# Patient Record
Sex: Female | Born: 1935 | Race: White | Hispanic: No | Smoking: Never smoker
Health system: Southern US, Community
[De-identification: ages and names within clinical notes are randomized; demographics above are authoritative.]

## PROBLEM LIST (undated history)

## (undated) DIAGNOSIS — E039 Hypothyroidism, unspecified: Secondary | ICD-10-CM

## (undated) DIAGNOSIS — I10 Essential (primary) hypertension: Secondary | ICD-10-CM

## (undated) DIAGNOSIS — H9190 Unspecified hearing loss, unspecified ear: Secondary | ICD-10-CM

## (undated) DIAGNOSIS — F0390 Unspecified dementia without behavioral disturbance: Secondary | ICD-10-CM

---

## 2019-05-27 ENCOUNTER — Emergency Department (HOSPITAL_COMMUNITY): Payer: Medicare Other

## 2019-05-27 ENCOUNTER — Encounter (HOSPITAL_COMMUNITY): Payer: Self-pay

## 2019-05-27 ENCOUNTER — Other Ambulatory Visit: Payer: Self-pay

## 2019-05-27 ENCOUNTER — Emergency Department (HOSPITAL_COMMUNITY)
Admission: EM | Admit: 2019-05-27 | Discharge: 2019-05-28 | Disposition: A | Discharge status: Skilled Nursing Facility | Payer: Medicare Other | Attending: Emergency Medicine | Admitting: Emergency Medicine

## 2019-05-27 DIAGNOSIS — F0390 Unspecified dementia without behavioral disturbance: Secondary | ICD-10-CM | POA: Insufficient documentation

## 2019-05-27 DIAGNOSIS — M25551 Pain in right hip: Secondary | ICD-10-CM | POA: Diagnosis not present

## 2019-05-27 DIAGNOSIS — Z20822 Contact with and (suspected) exposure to covid-19: Secondary | ICD-10-CM | POA: Diagnosis not present

## 2019-05-27 DIAGNOSIS — Z7189 Other specified counseling: Secondary | ICD-10-CM | POA: Insufficient documentation

## 2019-05-27 DIAGNOSIS — R531 Weakness: Secondary | ICD-10-CM | POA: Diagnosis present

## 2019-05-27 DIAGNOSIS — M25561 Pain in right knee: Secondary | ICD-10-CM | POA: Diagnosis not present

## 2019-05-27 DIAGNOSIS — M25559 Pain in unspecified hip: Secondary | ICD-10-CM

## 2019-05-27 LAB — CBC WITH DIFFERENTIAL/PLATELET
Abs Immature Granulocytes: 0.03 10*3/uL (ref 0.00–0.07)
Basophils Absolute: 0.1 10*3/uL (ref 0.0–0.1)
Basophils Relative: 1 %
Eosinophils Absolute: 0.1 10*3/uL (ref 0.0–0.5)
Eosinophils Relative: 1 %
HCT: 36.7 % (ref 36.0–46.0)
Hemoglobin: 11.6 g/dL — ABNORMAL LOW (ref 12.0–15.0)
Immature Granulocytes: 0 %
Lymphocytes Relative: 18 %
Lymphs Abs: 1.3 10*3/uL (ref 0.7–4.0)
MCH: 29.5 pg (ref 26.0–34.0)
MCHC: 31.6 g/dL (ref 30.0–36.0)
MCV: 93.4 fL (ref 80.0–100.0)
Monocytes Absolute: 0.6 10*3/uL (ref 0.1–1.0)
Monocytes Relative: 8 %
Neutro Abs: 5.2 10*3/uL (ref 1.7–7.7)
Neutrophils Relative %: 72 %
Platelets: 260 10*3/uL (ref 150–400)
RBC: 3.93 MIL/uL (ref 3.87–5.11)
RDW: 14.2 % (ref 11.5–15.5)
WBC: 7.3 10*3/uL (ref 4.0–10.5)
nRBC: 0 % (ref 0.0–0.2)

## 2019-05-27 LAB — LACTIC ACID, PLASMA: Lactic Acid, Venous: 1.4 mmol/L (ref 0.5–1.9)

## 2019-05-27 LAB — COMPREHENSIVE METABOLIC PANEL
ALT: 27 U/L (ref 0–44)
AST: 43 U/L — ABNORMAL HIGH (ref 15–41)
Albumin: 3.7 g/dL (ref 3.5–5.0)
Alkaline Phosphatase: 113 U/L (ref 38–126)
Anion gap: 9 (ref 5–15)
BUN: 20 mg/dL (ref 8–23)
CO2: 28 mmol/L (ref 22–32)
Calcium: 9.2 mg/dL (ref 8.9–10.3)
Chloride: 104 mmol/L (ref 98–111)
Creatinine, Ser: 0.97 mg/dL (ref 0.44–1.00)
GFR calc Af Amer: >60 mL/min (ref >60)
GFR calc non Af Amer: 52 mL/min — ABNORMAL LOW (ref >60)
Glucose, Bld: 112 mg/dL — ABNORMAL HIGH (ref 70–99)
Potassium: 4.1 mmol/L (ref 3.5–5.1)
Sodium: 141 mmol/L (ref 135–145)
Total Bilirubin: 0.5 mg/dL (ref 0.3–1.2)
Total Protein: 7.3 g/dL (ref 6.5–8.1)

## 2019-05-27 LAB — URINALYSIS, ROUTINE W REFLEX MICROSCOPIC
Bacteria, UA: NONE SEEN
Bilirubin Urine: NEGATIVE
Glucose, UA: NEGATIVE mg/dL
Ketones, ur: NEGATIVE mg/dL
Leukocytes,Ua: NEGATIVE
Nitrite: NEGATIVE
Protein, ur: 100 mg/dL — AB
Specific Gravity, Urine: 1.02 (ref 1.005–1.030)
pH: 5 (ref 5.0–8.0)

## 2019-05-27 LAB — PROTIME-INR
INR: 1 (ref 0.8–1.2)
Prothrombin Time: 12.6 seconds (ref 11.4–15.2)

## 2019-05-27 LAB — POC SARS CORONAVIRUS 2 AG -  ED: SARS Coronavirus 2 Ag: NEGATIVE

## 2019-05-27 LAB — BRAIN NATRIURETIC PEPTIDE: B Natriuretic Peptide: 517 pg/mL — ABNORMAL HIGH (ref 0.0–100.0)

## 2019-05-27 LAB — CBG MONITORING, ED: Glucose-Capillary: 108 mg/dL — ABNORMAL HIGH (ref 70–99)

## 2019-05-27 MED ORDER — ONDANSETRON HCL 4 MG PO TABS: 4.0000 mg | ORAL_TABLET | Freq: Three times a day (TID) | ORAL | Status: DC | PRN

## 2019-05-27 MED ORDER — FUROSEMIDE 10 MG/ML IJ SOLN
20.0000 mg | Freq: Every day | INTRAMUSCULAR | Status: DC
  Administered 2019-05-27: 20 mg via INTRAVENOUS
  Filled 2019-05-27: qty 4
  Filled 2019-05-27 (×2): qty 2

## 2019-05-27 MED ORDER — TIMOLOL MALEATE 0.5 % OP SOLN
1.0000 [drp] | Freq: Every day | OPHTHALMIC | Status: DC
  Administered 2019-05-27 – 2019-05-28 (×2): 1 [drp] via OPHTHALMIC
  Filled 2019-05-27 (×2): qty 5

## 2019-05-27 MED ORDER — FUROSEMIDE 40 MG PO TABS: 20.0000 mg | ORAL_TABLET | Freq: Every day | ORAL | Status: DC

## 2019-05-27 MED ORDER — GABAPENTIN 100 MG PO CAPS: 100.0000 mg | ORAL_CAPSULE | Freq: Every day | ORAL | Status: DC

## 2019-05-27 MED ORDER — LEVOTHYROXINE SODIUM 50 MCG PO TABS
50.0000 ug | ORAL_TABLET | Freq: Every day | ORAL | Status: DC
  Administered 2019-05-28: 50 ug via ORAL
  Filled 2019-05-27 (×2): qty 1

## 2019-05-27 NOTE — Progress Notes (Signed)
2nd shift ED CSW received a handoff from the 1st shift [facility] ED CSW.   Per 1st shift ED CSW PT has been consulted and a COVID test initiated by the EPD.  CSW spoke to EDP who requested that initiation of the placement process be started while awaiting the results of the CT scan and states that pt will require placement.  CSW spoke to pt's daughter to make her aware that:  1.  If a timely placement is found then the pt must go to the first placement, or choose from the facilities that the pt can immediately find placement if more than one, at or the pt must go home due to time constraints in the ED.  2.  If timely placement is not found then pt's daughter may have to arrange a safe D/C plan for the pt so that upon discharge the daughter can be ready to take pt home.  3.  Pt may not be appropriate for placement and pt will have to be taken home if so.  4. Pt's daughter will have to be agreeable to taking pt home after a possible SNF placement stay is completed (if placed) which is, on average, 10-20 days.  Pt's daughter was agreeable to the above and enthusiastic about picking the pt up from rehab and states her hope is that a SNF stay will get her mother able to care for herself somewhat.  CSW spoke pt's daughterand confirmed pt's daughter's plan to be discharged to SNF for rehab at discharge.  CSW provided active listening and validated pt's daughter's concerns.   CSW was given permission to complete FL-2 and send referrals out to SNF facilities via the hub per pt's request.  Pt has been living independently but with her daughter, prior to being admitted to [facility] ED.  CSW will continue to follow for D/C needs.  Dorothe Pea. Nazli Penn, LCSW, LCAS, CSI Transitions of Care Clinical Social Worker Care Coordination Department Ph: [phone]

## 2019-05-27 NOTE — ED Triage Notes (Signed)
Patient arrived via [facility]. Patient is from home. Patient is at baseline however has been having increased weakness for 3-5 days along with right hip and right knee pain. Patient is taken care of at home by family. Patient family and EMS stated foul odor from urine.

## 2019-05-27 NOTE — ED Provider Notes (Signed)
CT hip does not demonstrate occult fracture.  Patient awaiting nursing home placement.   Gerhard Munch, MD 05/27/19 [phone]

## 2019-05-27 NOTE — TOC Initial Note (Signed)
Transition of Care [facility]) - Initial/Assessment Note    Patient Details  Name: Tina Stein MRN: [id number] Date of Birth: 11-13-1927  [facility] of [facility]) CM/SW Contact:    Montine Circle, LCSW Phone Number: 05/27/2019, 2:46 PM  Clinical Narrative:                 CSW spoke with patient's daughter Arline Asp [phone]) regarding patient's needs. Arline Asp reports that she and patient live together and taking care of patient has become difficult for her. Patient has been declining and lately had been complaining of right knee and right hip pain. Arline Asp reports she has tried to get patient into an ALF a few months ago ([facility]) while trying to get Medicaid for patient, but ended the process. Arline Asp reports she can get the Medicaid process started again, but will have to fill out another application. Arline Asp reports she nor patient have the finances to pay out of pocket. Arline Asp reports she also works so patient is home by herself at times. Arline Asp reports patient has no DME at home.   CSW informed Arline Asp of the options to assist get better care for patient. CSW explained the SNF process. Arline Asp was agreeable for CSW to have EDP consult PT for an evaluation/recommendations. CSW encouraged Arline Asp to work on getting patient Medicaid as she will need somewhere to go after STR. In reviewing chart, COVID test has already been ordered. CSW will continue to follow up.   Expected Discharge Plan: Skilled Nursing Facility Barriers to Discharge: No Barriers Identified   Patient Goals and CMS Choice Patient states their goals for this hospitalization and ongoing recovery are:: To find better care for patient      Expected Discharge Plan and Services Expected Discharge Plan: Skilled Nursing Facility In-house Referral: Clinical Social Work   Post Acute Care Choice: Skilled Nursing Facility Living arrangements for the past 2 months: Single Family Home                                       Prior Living Arrangements/Services Living arrangements for the past 2 months: Single Family Home Lives with:: Adult Children Patient language and need for interpreter reviewed:: Yes Do you feel safe going back to the place where you live?: Yes      Need for Family Participation in Patient Care: Yes (Comment) Care giver support system in place?: Yes (comment)   Criminal Activity/Legal Involvement Pertinent to Current Situation/Hospitalization: No - Comment as needed  Activities of Daily Living      Permission Sought/Granted Permission sought to share information with : Family Supports, Magazine features editor    Share Information with NAME: Merryl Hacker  Permission granted to share info w AGENCY: SNFs  Permission granted to share info w Relationship: daughter  Permission granted to share info w Contact Information: [phone]  Emotional Assessment Appearance:: (Unable to assess) Attitude/Demeanor/Rapport: Unable to Assess Affect (typically observed): Unable to Assess Orientation: : Fluctuating Orientation (Suspected and/or reported Sundowners)(patient has dementia) Alcohol / Substance Use: Not Applicable Psych Involvement: No (comment)  Admission diagnosis:  weakness right leg pain There are no problems to display for this patient.  PCP:  Laqueta Due., MD Pharmacy:  No Pharmacies Listed    Social Determinants of Health (SDOH) Interventions    Readmission Risk Interventions No flowsheet data found.

## 2019-05-27 NOTE — Progress Notes (Signed)
Pt was provided with a PASRR on 05/27/19 by [place] MUST:   [id number]   CSW will continue to follow for D/C needs.  Dorothe Pea. Zayla Agar, LCSW, LCAS, CSI Transitions of Care Clinical Social Worker Care Coordination Department Ph: [phone]

## 2019-05-27 NOTE — Progress Notes (Signed)
Assessment complete; FL-2 created/signed by EPD, negative COVID result obtained, PT will have to see pt again on 1/14 as pt was in CT when PT arrived.  Referrals sent out to [facility] [place] [facility], per pt's daughter's wishes.  2nd shift ED CSW will leave handoff for 1st shift ED CSW.  CSW will continue to follow for D/C needs.  Dorothe Pea. Bryer Cozzolino, LCSW, LCAS, CSI Transitions of Care Clinical Social Worker Care Coordination Department Ph: [phone]

## 2019-05-27 NOTE — ED Provider Notes (Signed)
[facility] DEPT [facility] Emergency Department Provider Note MRN:  [id number]  Arrival date & time: 05/27/19     Chief Complaint   Weakness, Possible UTI, Right Hip Pain, and Right Knee Pain   History of Present Illness   Tina Stein is a 86 y.o. year-old female with a history of dementia presenting to the ED with chief complaint of weakness.  Reported weakness, hip pain, knee pain, foul-smelling urine.  Unable to reach family.  I was unable to obtain an accurate HPI, PMH, or ROS due to the patient's dementia.  Level 5 caveat.  Review of Systems  Positive for weakness, hip pain, foul-smelling urine.  Patient's Health History   History reviewed. No pertinent past medical history.  History reviewed. No pertinent surgical history.  No family history on file.  Social History   Socioeconomic History  . Marital status: Widowed    Spouse name: Not on file  . Number of children: Not on file  . Years of education: Not on file  . Highest education level: Not on file  Occupational History  . Not on file  Tobacco Use  . Smoking status: Unknown If Ever Smoked  Substance and Sexual Activity  . Alcohol use: Not Currently  . Drug use: Never  . Sexual activity: Not Currently  Other Topics Concern  . Not on file  Social History Narrative  . Not on file   Social Determinants of Health   Financial Resource Strain:   . Difficulty of Paying Living Expenses: Not on file  Food Insecurity:   . Worried About Programme researcher, broadcasting/film/video in the Last Year: Not on file  . Ran Out of Food in the Last Year: Not on file  Transportation Needs:   . Lack of Transportation (Medical): Not on file  . Lack of Transportation (Non-Medical): Not on file  Physical Activity:   . Days of Exercise per Week: Not on file  . Minutes of Exercise per Session: Not on file  Stress:   . Feeling of Stress : Not on file  Social Connections:   . Frequency of Communication with Friends and Family: Not on file    . Frequency of Social Gatherings with Friends and Family: Not on file  . Attends Religious Services: Not on file  . Active Member of Clubs or Organizations: Not on file  . Attends Banker Meetings: Not on file  . Marital Status: Not on file  Intimate Partner Violence:   . Fear of Current or Ex-Partner: Not on file  . Emotionally Abused: Not on file  . Physically Abused: Not on file  . Sexually Abused: Not on file     Physical Exam  Vital Signs and Nursing Notes reviewed Vitals:   05/27/19 1400 05/27/19 1430  BP: (!) 147/110 136/62  Pulse: 82 (!) 101  Resp: (!) 23 17  Temp:    SpO2: 99% 99%    CONSTITUTIONAL: Chronically ill-appearing, NAD NEURO:  Alert and oriented x 3, no focal deficits EYES:  eyes equal and reactive ENT/NECK:  no LAD, no JVD CARDIO: Regular rate, well-perfused, normal S1 and S2 PULM:  CTAB no wheezing or rhonchi GI/GU:  normal bowel sounds, non-distended, non-tender MSK/SPINE:  No gross deformities, no edema; pain elicited with range of motion of the right knee and hip SKIN:  no rash, atraumatic PSYCH:  Appropriate speech and behavior  Diagnostic and Interventional Summary    EKG Interpretation  Date/Time:    Ventricular Rate:  PR Interval:    QRS Duration:   QT Interval:    QTC Calculation:   R Axis:     Text Interpretation:        Labs Reviewed  COMPREHENSIVE METABOLIC PANEL - Abnormal; Notable for the following components:      Result Value   Glucose, Bld 112 (*)    AST 43 (*)    GFR calc non Af Amer 52 (*)    All other components within normal limits  CBC WITH DIFFERENTIAL/PLATELET - Abnormal; Notable for the following components:   Hemoglobin 11.6 (*)    All other components within normal limits  URINALYSIS, ROUTINE W REFLEX MICROSCOPIC - Abnormal; Notable for the following components:   Hgb urine dipstick SMALL (*)    Protein, ur 100 (*)    All other components within normal limits  BRAIN NATRIURETIC PEPTIDE -  Abnormal; Notable for the following components:   B Natriuretic Peptide 517.0 (*)    All other components within normal limits  CBG MONITORING, ED - Abnormal; Notable for the following components:   Glucose-Capillary 108 (*)    All other components within normal limits  URINE CULTURE  LACTIC ACID, PLASMA  PROTIME-INR  POC SARS CORONAVIRUS 2 AG -  ED    XR Pelvis    (Results Pending)  DG Femur Min 2 Views Right    (Results Pending)  DG Knee Complete 4 Views Right    (Results Pending)  DG Chest Portable 1 View    (Results Pending)    Medications - No data to display   Procedures  /  Critical Care Procedures  ED Course and Medical Decision Making  I have reviewed the triage vital signs, the nursing notes, and pertinent available records from the EMR.  Pertinent labs & imaging results that were available during my care of the patient were reviewed by me and considered in my medical decision making (see below for details).     Considering UTI, metabolic disarray, dehydration, hip fracture, work-up pending.  X-rays are pending.  Patient's daughter is bedside and explains that her dementia has slowly worsen for the past several months and now that patient has been unable to ambulate, she is unable to care for patient at home.  There is no other assistance in the home.  Daughter interested in SNF placement.  If x-rays are without fracture or reason for admission, will need to stay in the emergency department until transitional care team can place.  There was a recent fall, so I suspect traumatic reason for patient's pain.  No fever, no leukocytosis, doubt septic joint.  Would consider CT scan of the hip if there is no obvious fracture but considerable osteopenia.  Signed out to oncoming provider at shift change.  Barth Kirks. Sedonia Small, MD [place] [email or web address]  Final Clinical Impressions(s) / ED Diagnoses     ICD-10-CM   1. Hip pain   M25.559     ED Discharge Orders    None       Discharge Instructions Discussed with and Provided to Patient:   Discharge Instructions   None       Maudie Flakes, MD 05/27/19 1459

## 2019-05-27 NOTE — NC FL2 (Signed)
  [place] MEDICAID FL2 LEVEL OF CARE SCREENING TOOL     IDENTIFICATION  Patient Name: Tina Stein Birthdate: [birth year] Sex: female Admission Date (Current Location): 05/27/2019  [place] [facility] [place] Number:  Producer, television/film/video and Address:  [facility],  [address] [place] [address]      Provider Number: [id number]  Attending Physician Name and Address:  Default, Provider, MD  Relative Name and Phone Number:       Current Level of Care: Hospital Recommended Level of Care: Skilled Nursing Facility Prior Approval Number:    Date Approved/Denied:   PASRR Number: [id number]  Discharge Plan: SNF    Current Diagnoses: There are no problems to display for this patient.   Orientation RESPIRATION BLADDER Height & Weight     Self, Time, Situation, Place  Normal Incontinent Weight: 150 lb 12.7 oz (68.4 kg) Height:  5\' 4"  (162.6 cm)  BEHAVIORAL SYMPTOMS/MOOD NEUROLOGICAL BOWEL NUTRITION STATUS      Incontinent Diet(Regular)  AMBULATORY STATUS COMMUNICATION OF NEEDS Skin   Extensive Assist Verbally Normal(Pt has stage 1 moisture-associated redness on pt's bottom and scaral lumbar region)                       Personal Care Assistance Level of Assistance  Bathing, Dressing Bathing Assistance: Limited assistance   Dressing Assistance: Limited assistance     Functional Limitations Info  (Pt has altered mental status) Sight Info: Adequate   Speech Info: Adequate    SPECIAL CARE FACTORS FREQUENCY  PT (By licensed PT), OT (By licensed OT)     PT Frequency: 5 OT Frequency: 5            Contractures Contractures Info: Not present    Additional Factors Info  Code Status, Allergies Code Status Info: Not on file Allergies Info: No Known Allergies           Current Medications (05/27/2019):  This is the current hospital active medication list Current Facility-Administered Medications  Medication Dose Route Frequency Provider Last  Rate Last Admin  . furosemide (LASIX) tablet 20 mg  20 mg Oral Daily 05/29/2019, MD      . gabapentin (NEURONTIN) capsule 100 mg  100 mg Oral QHS Gerhard Munch, MD      . Gerhard Munch ON 05/28/2019] levothyroxine (SYNTHROID) tablet 50 mcg  50 mcg Oral Q0600 05/30/2019, MD      . ondansetron [facility]) tablet 4 mg  4 mg Oral Q8H PRN [facility], MD      . timolol (TIMOPTIC) 0.5 % ophthalmic solution 1 drop  1 drop Both Eyes Daily Gerhard Munch, MD       Current Outpatient Medications  Medication Sig Dispense Refill  . furosemide (LASIX) 20 MG tablet take 1 tablet by mouth once daily    . levothyroxine (SYNTHROID) 50 MCG tablet take 1 tablet by mouth once daily    . ondansetron (ZOFRAN) 4 MG tablet Take 4 mg by mouth every 8 (eight) hours as needed for vomiting.     . timolol (TIMOPTIC) 0.5 % ophthalmic solution Place 1 drop into both eyes daily.     Gerhard Munch gabapentin (NEURONTIN) 100 MG capsule Take 100 mg by mouth at bedtime.       Discharge Medications: Please see discharge summary for a list of discharge medications.  Relevant Imaging Results:  Relevant Lab Results:   Additional Information Marland Kitchen  [id number] Panfilo Ketchum, LCSW

## 2019-05-27 NOTE — ED Notes (Signed)
Patient transported to X-ray 

## 2019-05-27 NOTE — Progress Notes (Signed)
PT Cancellation Note  Patient Details Name: Wendi Lastra MRN: [id number] DOB: June 02, 1927   Cancelled Treatment:    Reason Eval/Treat Not Completed: Patient at procedure or test/unavailable(pt out of room getting CT. Will check back at later date/time as schedule allows and when patient is availble.)   Renaldo Fiddler PT, DPT Physical Therapist with [facility] [phone]  05/27/2019 5:18 PM

## 2019-05-27 NOTE — ED Notes (Signed)
Patient has been cleaned and changed. Patient presented with feces covering vagina and buttocks.

## 2019-05-28 LAB — URINE CULTURE: Culture: NO GROWTH

## 2019-05-28 NOTE — ED Notes (Signed)
Pt found covered in urine and stool, along with feet having feces on them. Pt provided with a partial bath and peri care, a new purewick and fresh linens. Pt also provided with barrier cream due to bottom being red. Pt repositioned in bed and given warm blankets. Pt's bed is in the lowest position.

## 2019-05-28 NOTE — ED Notes (Signed)
This lady is sleeping all day.  She wakes up without difficulty but goes right back to sleep.  She has not eaten anything today.

## 2019-05-28 NOTE — Discharge Planning (Signed)
Clinical Social Work is seeking post-discharge placement for this patient at the following level of care: SNF.    

## 2019-05-28 NOTE — Progress Notes (Addendum)
CSW received a call from Velna Hatchet at [facility] stating the patient has been offered a bed and has been accepted and that the pt can arrive ASAP on 05/28/19.  The pt's accepting doctor is the SNF MD.  The room number will be [address].  The number for report is [phone] and ask for the [facility] phone.  CSW will update RN and EDP.  Dorothe Pea. Jonnatan Hanners, Theresia Majors, LCAS Clinical Social Worker Ph: [phone]

## 2019-05-28 NOTE — ED Notes (Signed)
Patient is resting quietly.  No distress noted.  Patients daughter called to check on her.

## 2019-05-28 NOTE — ED Notes (Signed)
[facility] called for transport.  

## 2019-05-28 NOTE — Discharge Instructions (Signed)
Please follow-up with your physician.  Return here for concerning changes in your condition. 

## 2019-05-28 NOTE — ED Notes (Signed)
Pt discharged safely with [facility].  Pt was in no distress.  All belongings were sent with patient. 

## 2019-05-28 NOTE — Progress Notes (Signed)
CSW continues to seek SNF placement. CSW awaiting PT to come evaluate patient.   Geralyn Corwin, LCSW Transitions of Care Department [facility] ED [phone]

## 2019-05-28 NOTE — Evaluation (Signed)
Physical Therapy Evaluation Patient Details Name: Tina Stein MRN: [id number] DOB: 1927-08-29 Today's Date: 05/28/2019   History of Present Illness  Tina Stein is a 86 y.o. year-old female with a history of dementia presenting to the ED with chief complaint of weakness.    Clinical Impression  Tina Stein is 86 y.o. female admitted with above HPI and diagnosis. Patient is currently limited by functional impairments below (see PT problem list). Patient has been living with her daughter who is no longer abel to provide the amount of care/assistance required. Patient currently requires mod-max assist for bed mob and transfers with RW. Patient will benefit from continued skilled PT interventions to address impairments and progress independence with mobility, recommending SNF with 24/7 assist. Acute PT will follow and progress as able.     Follow Up Recommendations SNF;Supervision/Assistance - 24 hour    Equipment Recommendations  None recommended by PT;Other (comment)(defer to facility)    Recommendations for Other Services       Precautions / Restrictions Precautions Precautions: Fall Restrictions Weight Bearing Restrictions: No      Mobility  Bed Mobility Overal bed mobility: Needs Assistance Bed Mobility: Supine to Sit;Sit to Supine     Supine to sit: Max assist;Mod assist Sit to supine: +2 for safety/equipment;+2 for physical assistance;Total assist   General bed mobility comments: pt with diffuclty follow simple verbal cues and required tactile and visual cues for sequencing UE reaching and use of bed rails. Mod assist required for LE mobility to move to EOB and Max assis to raise trunk upright to sit and scoot to EOB. Max +2 required to return to supine as pt was unable to follow cues..  Transfers Overall transfer level: Needs assistance Equipment used: Rolling walker (2 wheeled) Transfers: Sit to/from Stand Sit to Stand: Mod assist;From elevated surface          General transfer comment: visual and tactile cues for hand placement on RW and mod assist to initiate power up from EOB. therapist blockign pt's feet from sliding initiatlly upon rising.  Ambulation/Gait Ambulation/Gait assistance: Mod assist Gait Distance (Feet): 3 Feet Assistive device: Rolling walker (2 wheeled) Gait Pattern/deviations: Step-to pattern;Decreased stride length;Narrow base of support Gait velocity: slow and unsteady   General Gait Details: pt able to perform small side stepping at EOB with mod assist for balance and to manage RW position. She had difficulty sequencing steps and attempted to move RW outside BOS multiple times. Pt with greater difficulty performing forward step compared to side steps. Pt expressed fear of falling while standing and stepping.  Stairs            Wheelchair Mobility    Modified Rankin (Stroke Patients Only)       Balance Overall balance assessment: Needs assistance Sitting-balance support: Feet supported;Bilateral upper extremity supported Sitting balance-Leahy Scale: Fair     Standing balance support: During functional activity;Bilateral upper extremity supported Standing balance-Leahy Scale: Poor                Pertinent Vitals/Pain Pain Assessment: Faces Faces Pain Scale: Hurts even more Pain Location: generalized with LE mobility Pain Descriptors / Indicators: Grimacing;Moaning Pain Intervention(s): Monitored during session;Limited activity within patient's tolerance    Home Living Family/patient expects to be discharged to:: Skilled nursing facility                 Additional Comments: pt admitted from home with daughter, pt unable to provide history for home environement. chart reveiw indicates pt's daughter  is concerned she will have trouble caring for her as she has required increased assistance in last several months and has been unable to ambulate in home.    Prior Function          Hand  Dominance        Extremity/Trunk Assessment   Upper Extremity Assessment Upper Extremity Assessment: Generalized weakness    Lower Extremity Assessment Lower Extremity Assessment: Generalized weakness    Cervical / Trunk Assessment Cervical / Trunk Assessment: Kyphotic  Communication      Cognition Arousal/Alertness: Awake/alert Behavior During Therapy: WFL for tasks assessed/performed Overall Cognitive Status: History of cognitive impairments - at baseline            General Comments: pt with baseline dementia and pt unable to answer simple questions and unable to consitently follow one step commands. she requried tactile cues for sequencing bed mob and transfers.      General Comments      Exercises     Assessment/Plan    PT Assessment Patient needs continued PT services  PT Problem List Decreased strength;Decreased balance;Decreased mobility;Decreased activity tolerance       PT Treatment Interventions Balance training;Patient/family education;DME instruction;Functional mobility training;Therapeutic activities;Gait training;Therapeutic exercise    PT Goals (Current goals can be found in the Care Plan section)  Acute Rehab PT Goals PT Goal Formulation: Patient unable to participate in goal setting Time For Goal Achievement: 06/11/19 Potential to Achieve Goals: Fair    Frequency Min 2X/week   Barriers to discharge   pt's daughter is unable to provide enough assistance at home       AM-PAC PT "6 Clicks" Mobility  Outcome Measure Help needed turning from your back to your side while in a flat bed without using bedrails?: A Lot Help needed moving from lying on your back to sitting on the side of a flat bed without using bedrails?: A Lot Help needed moving to and from a bed to a chair (including a wheelchair)?: Total Help needed standing up from a chair using your arms (e.g., wheelchair or bedside chair)?: Total Help needed to walk in hospital room?:  Total Help needed climbing 3-5 steps with a railing? : Total 6 Click Score: 8    End of Session Equipment Utilized During Treatment: Gait belt Activity Tolerance: Patient tolerated treatment well Patient left: with call bell/phone within reach;in bed Nurse Communication: Mobility status PT Visit Diagnosis: Muscle weakness (generalized) (M62.81);Unsteadiness on feet (R26.81);Difficulty in walking, not elsewhere classified (R26.2)    Time: 4287-6811 PT Time Calculation (min) (ACUTE ONLY): 24 min   Charges:   PT Evaluation $PT Eval Low Complexity: 1 Low          Wynn Maudlin, DPT Physical Therapist with [facility] [phone]  05/28/2019 12:34 PM

## 2019-05-28 NOTE — Progress Notes (Signed)
Patient has been accepted into [facility]. Patient will be going to room number 602W and number for report is [phone]. Patient can go via PTAR. EDP and RN aware. CSW also contacted patient's daughter to inform her of the disposition.   Geralyn Corwin, LCSW Transitions of Care Department [facility] ED [phone]

## 2019-11-11 ENCOUNTER — Inpatient Hospital Stay (HOSPITAL_COMMUNITY)
Admission: EM | Admit: 2019-11-11 | Discharge: 2019-11-16 | DRG: 481 | Disposition: A | Discharge status: Skilled Nursing Facility | Payer: Medicare Other | Source: Skilled Nursing Facility | Attending: Internal Medicine | Admitting: Internal Medicine

## 2019-11-11 ENCOUNTER — Emergency Department (HOSPITAL_COMMUNITY): Payer: Medicare Other

## 2019-11-11 ENCOUNTER — Other Ambulatory Visit: Payer: Self-pay

## 2019-11-11 ENCOUNTER — Encounter (HOSPITAL_COMMUNITY): Payer: Self-pay | Admitting: Internal Medicine

## 2019-11-11 DIAGNOSIS — F0390 Unspecified dementia without behavioral disturbance: Secondary | ICD-10-CM | POA: Diagnosis present

## 2019-11-11 DIAGNOSIS — D638 Anemia in other chronic diseases classified elsewhere: Secondary | ICD-10-CM | POA: Diagnosis present

## 2019-11-11 DIAGNOSIS — Z23 Encounter for immunization: Secondary | ICD-10-CM | POA: Diagnosis not present

## 2019-11-11 DIAGNOSIS — G3109 Other frontotemporal dementia: Secondary | ICD-10-CM

## 2019-11-11 DIAGNOSIS — E039 Hypothyroidism, unspecified: Secondary | ICD-10-CM | POA: Diagnosis present

## 2019-11-11 DIAGNOSIS — W19XXXA Unspecified fall, initial encounter: Secondary | ICD-10-CM

## 2019-11-11 DIAGNOSIS — Z20822 Contact with and (suspected) exposure to covid-19: Secondary | ICD-10-CM | POA: Diagnosis present

## 2019-11-11 DIAGNOSIS — Z79899 Other long term (current) drug therapy: Secondary | ICD-10-CM

## 2019-11-11 DIAGNOSIS — F0280 Dementia in other diseases classified elsewhere without behavioral disturbance: Secondary | ICD-10-CM

## 2019-11-11 DIAGNOSIS — H9190 Unspecified hearing loss, unspecified ear: Secondary | ICD-10-CM | POA: Diagnosis present

## 2019-11-11 DIAGNOSIS — W050XXA Fall from non-moving wheelchair, initial encounter: Secondary | ICD-10-CM | POA: Diagnosis present

## 2019-11-11 DIAGNOSIS — Z66 Do not resuscitate: Secondary | ICD-10-CM | POA: Diagnosis present

## 2019-11-11 DIAGNOSIS — S72002A Fracture of unspecified part of neck of left femur, initial encounter for closed fracture: Secondary | ICD-10-CM | POA: Diagnosis present

## 2019-11-11 DIAGNOSIS — S72142A Displaced intertrochanteric fracture of left femur, initial encounter for closed fracture: Principal | ICD-10-CM | POA: Diagnosis present

## 2019-11-11 DIAGNOSIS — Y92128 Other place in nursing home as the place of occurrence of the external cause: Secondary | ICD-10-CM

## 2019-11-11 DIAGNOSIS — D62 Acute posthemorrhagic anemia: Secondary | ICD-10-CM | POA: Diagnosis not present

## 2019-11-11 DIAGNOSIS — Z79890 Hormone replacement therapy (postmenopausal): Secondary | ICD-10-CM

## 2019-11-11 DIAGNOSIS — D72829 Elevated white blood cell count, unspecified: Secondary | ICD-10-CM | POA: Diagnosis not present

## 2019-11-11 DIAGNOSIS — Z419 Encounter for procedure for purposes other than remedying health state, unspecified: Secondary | ICD-10-CM

## 2019-11-11 DIAGNOSIS — I10 Essential (primary) hypertension: Secondary | ICD-10-CM | POA: Diagnosis present

## 2019-11-11 HISTORY — DX: Unspecified dementia, unspecified severity, without behavioral disturbance, psychotic disturbance, mood disturbance, and anxiety: F03.90

## 2019-11-11 HISTORY — DX: Essential (primary) hypertension: I10

## 2019-11-11 HISTORY — DX: Hypothyroidism, unspecified: E03.9

## 2019-11-11 HISTORY — DX: Unspecified hearing loss, unspecified ear: H91.90

## 2019-11-11 LAB — BASIC METABOLIC PANEL
Anion gap: 13 (ref 5–15)
BUN: 36 mg/dL — ABNORMAL HIGH (ref 8–23)
CO2: 26 mmol/L (ref 22–32)
Calcium: 9 mg/dL (ref 8.9–10.3)
Chloride: 100 mmol/L (ref 98–111)
Creatinine, Ser: 1.24 mg/dL — ABNORMAL HIGH (ref 0.44–1.00)
GFR calc Af Amer: 45 mL/min — ABNORMAL LOW (ref >60)
GFR calc non Af Amer: 38 mL/min — ABNORMAL LOW (ref >60)
Glucose, Bld: 182 mg/dL — ABNORMAL HIGH (ref 70–99)
Potassium: 4 mmol/L (ref 3.5–5.1)
Sodium: 139 mmol/L (ref 135–145)

## 2019-11-11 LAB — CBC WITH DIFFERENTIAL/PLATELET
Abs Immature Granulocytes: 0.08 10*3/uL — ABNORMAL HIGH (ref 0.00–0.07)
Basophils Absolute: 0.1 10*3/uL (ref 0.0–0.1)
Basophils Relative: 0 %
Eosinophils Absolute: 0 10*3/uL (ref 0.0–0.5)
Eosinophils Relative: 0 %
HCT: 31.2 % — ABNORMAL LOW (ref 36.0–46.0)
Hemoglobin: 9.7 g/dL — ABNORMAL LOW (ref 12.0–15.0)
Immature Granulocytes: 1 %
Lymphocytes Relative: 6 %
Lymphs Abs: 1.1 10*3/uL (ref 0.7–4.0)
MCH: 30 pg (ref 26.0–34.0)
MCHC: 31.1 g/dL (ref 30.0–36.0)
MCV: 96.6 fL (ref 80.0–100.0)
Monocytes Absolute: 0.7 10*3/uL (ref 0.1–1.0)
Monocytes Relative: 4 %
Neutro Abs: 15.2 10*3/uL — ABNORMAL HIGH (ref 1.7–7.7)
Neutrophils Relative %: 89 %
Platelets: 342 10*3/uL (ref 150–400)
RBC: 3.23 MIL/uL — ABNORMAL LOW (ref 3.87–5.11)
RDW: 13.7 % (ref 11.5–15.5)
WBC: 17.1 10*3/uL — ABNORMAL HIGH (ref 4.0–10.5)
nRBC: 0 % (ref 0.0–0.2)

## 2019-11-11 LAB — CK: Total CK: 38 U/L (ref 38–234)

## 2019-11-11 LAB — SARS CORONAVIRUS 2 BY RT PCR (HOSPITAL ORDER, PERFORMED IN ~~LOC~~ HOSPITAL LAB): SARS Coronavirus 2: NEGATIVE

## 2019-11-11 MED ORDER — ACETAMINOPHEN 325 MG PO TABS
650.0000 mg | ORAL_TABLET | Freq: Four times a day (QID) | ORAL | Status: DC
  Administered 2019-11-11 – 2019-11-12 (×2): 650 mg via ORAL
  Filled 2019-11-11 (×2): qty 2

## 2019-11-11 MED ORDER — CEFAZOLIN SODIUM-DEXTROSE 2-4 GM/100ML-% IV SOLN
2.0000 g | INTRAVENOUS | Status: AC
  Administered 2019-11-12: 2 g via INTRAVENOUS
  Filled 2019-11-11 (×2): qty 100

## 2019-11-11 MED ORDER — HEPARIN SODIUM (PORCINE) 5000 UNIT/ML IJ SOLN
5000.0000 [IU] | Freq: Three times a day (TID) | INTRAMUSCULAR | Status: DC
  Administered 2019-11-11: 5000 [IU] via SUBCUTANEOUS
  Filled 2019-11-11: qty 1

## 2019-11-11 MED ORDER — SODIUM CHLORIDE 0.9 % IV BOLUS
500.0000 mL | Freq: Once | INTRAVENOUS | Status: AC
  Administered 2019-11-11: 500 mL via INTRAVENOUS

## 2019-11-11 MED ORDER — HALOPERIDOL LACTATE 5 MG/ML IJ SOLN: 1.0000 mg | Freq: Four times a day (QID) | INTRAMUSCULAR | Status: AC | PRN

## 2019-11-11 MED ORDER — MORPHINE SULFATE (PF) 4 MG/ML IV SOLN
4.0000 mg | Freq: Once | INTRAVENOUS | Status: AC
  Administered 2019-11-11: 4 mg via INTRAVENOUS
  Filled 2019-11-11: qty 1

## 2019-11-11 MED ORDER — HYDROCODONE-ACETAMINOPHEN 5-325 MG PO TABS: 1.0000 | ORAL_TABLET | Freq: Four times a day (QID) | ORAL | Status: DC | PRN

## 2019-11-11 MED ORDER — DEXTROSE-NACL 5-0.45 % IV SOLN: INTRAVENOUS | Status: DC

## 2019-11-11 MED ORDER — TETANUS-DIPHTH-ACELL PERTUSSIS 5-2.5-18.5 LF-MCG/0.5 IM SUSP
0.5000 mL | Freq: Once | INTRAMUSCULAR | Status: AC
  Administered 2019-11-11: 0.5 mL via INTRAMUSCULAR
  Filled 2019-11-11: qty 0.5

## 2019-11-11 MED ORDER — MORPHINE SULFATE (PF) 2 MG/ML IV SOLN
0.5000 mg | INTRAVENOUS | Status: DC | PRN
  Administered 2019-11-11: 0.5 mg via INTRAVENOUS
  Filled 2019-11-11: qty 1

## 2019-11-11 MED ORDER — HALOPERIDOL 1 MG PO TABS
1.0000 mg | ORAL_TABLET | Freq: Four times a day (QID) | ORAL | Status: AC | PRN
  Filled 2019-11-11: qty 1

## 2019-11-11 MED ORDER — LORAZEPAM 2 MG/ML IJ SOLN
0.5000 mg | INTRAMUSCULAR | Status: AC | PRN
  Administered 2019-11-12: 0.5 mg via INTRAVENOUS
  Filled 2019-11-11: qty 1

## 2019-11-11 NOTE — ED Provider Notes (Signed)
[facility]   CSN: [id number] Arrival date & time: 11/11/19  1538    History   Tina GongBetty Stein is a 86 y.o. female   Patient with dementia, unable to provide history.  Per EMS patient found in the hall.  Unsure of situation surrounding this.  Per EMS patient was complaining of left hip pain.  Stable vital signs with EMS   Level 5 caveat-altered mental status secondary to dementia  Consult with patient's daughter Merryl HackerCindy Leonard at [phone] States she is unsure if patient walks at baseline.  Whenever she visits her she is always sitting in the chair.  I did try to consult with patient greater to see her mobility status however they are not picking up the phone.  Daughter states her mother is at baseline mentation.  Is unable to have a conversation at baseline or follow commands.  Does not follow with Ortho outpatient.  Attempt to collect collateral information [facility] however unable to reach nursing staff    HPI     No past medical history on file.  There are no problems to display for this patient.   No past surgical history on file.   OB History   No obstetric history on file.     No family history on file.  Social History   Tobacco Use  . Smoking status: Unknown If Ever Smoked  Vaping Use  . Vaping Use: Never used  Substance Use Topics  . Alcohol use: Not Currently  . Drug use: Never    Home Medications Prior to Admission medications   Medication Sig Start Date End Date Taking? Authorizing Provider  furosemide (LASIX) 20 MG tablet take 1 tablet by mouth once daily 07/16/16   [provider]  gabapentin (NEURONTIN) 100 MG capsule Take 100 mg by mouth at bedtime. 12/15/18   [provider]  levothyroxine (SYNTHROID) 50 MCG tablet take 1 tablet by mouth once daily 10/14/15   [provider]  ondansetron (ZOFRAN) 4 MG tablet Take 4 mg by mouth every  8 (eight) hours as needed for vomiting.  01/31/17   [provider]  timolol (TIMOPTIC) 0.5 % ophthalmic solution Place 1 drop into both eyes daily.  09/06/15   [provider]    Allergies    Patient has no known allergies.  Review of Systems   Review of Systems  Unable to perform ROS: Dementia  All other systems reviewed and are negative.   Physical Exam Updated Vital Signs BP (!) 142/80   Pulse 88   Temp (!) 97.3 F (36.3 C)   Resp 16   SpO2 100%   Physical Exam Vitals and nursing note reviewed.  Constitutional:      General: She is not in acute distress.    Appearance: She is well-developed. She is not ill-appearing, toxic-appearing or diaphoretic.     Comments: Mumbling incoherently   HENT:     Head: Atraumatic.  Eyes:     Pupils: Pupils are equal, round, and reactive to light.  Cardiovascular:     Rate and Rhythm: Normal rate.     Pulses: Normal pulses.          Dorsalis pedis pulses are 2+ on the right side and 2+ on the left side.       Posterior tibial pulses are 2+ on the right side and 2+ on the left side.     Heart sounds: Normal heart sounds.  Pulmonary:     Effort: Pulmonary effort is normal. No respiratory distress.     Breath sounds: Normal breath sounds.  Abdominal:     General: Bowel sounds are normal. There is no distension.     Tenderness: There is no abdominal tenderness. There is no right CVA tenderness, left CVA tenderness, guarding or rebound.  Musculoskeletal:        General: Normal range of motion.     Cervical back: Normal range of motion.     Comments: Bent at bilateral knees.  No bony tenderness.  Pelvis stable.  Skin:    General: Skin is warm and dry.     Capillary Refill: Capillary refill takes less than 2 seconds.     Comments: Varying stages of ecchymosis.  Skin tear to left olecranon.  Some chronic changes to bilateral lower extremities.  Lacerations to suture  Neurological:     Mental Status: She is alert.      Comments: No facial droop.  Moves all 4 extremities.  Unable to follow commands.  Cannot participate in conversation due to dementia    ED Results / Procedures / Treatments   Labs (all labs ordered are listed, but only abnormal results are displayed) Labs Reviewed  CBC WITH DIFFERENTIAL/PLATELET - Abnormal; Notable for the following components:      Result Value   WBC 17.1 (*)    RBC 3.23 (*)    Hemoglobin 9.7 (*)    HCT 31.2 (*)    Neutro Abs 15.2 (*)    Abs Immature Granulocytes 0.08 (*)    All other components within normal limits  SARS CORONAVIRUS 2 BY RT PCR (HOSPITAL ORDER, PERFORMED IN [facility])  BASIC METABOLIC PANEL  CK    EKG None  Radiology DG Chest 2 View  Result Date: 11/11/2019 CLINICAL DATA:  Unwitnessed fall. EXAM: CHEST - 2 VIEW COMPARISON:  May 27, 2019. FINDINGS: The heart size and mediastinal contours are within normal limits. Both lungs are clear. No pneumothorax or pleural effusion is noted. The visualized skeletal structures are unremarkable. IMPRESSION: No active cardiopulmonary disease. Aortic Atherosclerosis (ICD10-I70.0). Electronically Signed   By: Lupita Raider M.D.   On: 11/11/2019 17:08   DG Elbow Complete Left  Result Date: 11/11/2019 CLINICAL DATA:  Unwitnessed fall. EXAM: LEFT ELBOW - COMPLETE 3+ VIEW COMPARISON:  None. FINDINGS: There is no evidence of fracture, dislocation, or joint effusion. There is no evidence of arthropathy or other focal bone abnormality. Soft tissues are unremarkable. IMPRESSION: Negative. Electronically Signed   By: Lupita Raider M.D.   On: 11/11/2019 17:12   CT Head Wo Contrast  Result Date: 11/11/2019 CLINICAL DATA:  Unwitnessed fall EXAM: CT HEAD WITHOUT CONTRAST TECHNIQUE: Contiguous axial images were obtained from the base of the skull through the vertex without intravenous contrast. COMPARISON:  None. FINDINGS: Brain: Low-density area noted in the right parietal lobe compatible with infarct,  age indeterminate. There is atrophy and chronic small vessel disease changes. No hemorrhage or hydrocephalus. Vascular: No hyperdense vessel or unexpected calcification. Skull: No acute calvarial abnormality. Sinuses/Orbits: Visualized paranasal sinuses and mastoids clear. Orbital soft tissues unremarkable. 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 Atrophy, chronic small vessel disease. Electronically Signed   By: Charlett Nose M.D.   On: 11/11/2019 17:24   CT Cervical Spine Wo Contrast  Result Date: 11/11/2019 CLINICAL DATA:  Neck pain EXAM: CT CERVICAL SPINE WITHOUT CONTRAST TECHNIQUE: Multidetector CT imaging of the cervical spine was performed  without intravenous contrast. Multiplanar CT image reconstructions were also generated. COMPARISON:  None. FINDINGS: Alignment: Normal Skull base and vertebrae: Choose 1 Soft tissues and spinal canal: No prevertebral fluid or swelling. No visible canal hematoma. Disc levels: Degenerative disc disease with disc space narrowing and spurring at C6-7. Mild bilateral degenerative facet disease. Upper chest: No acute findings Other: None IMPRESSION: No acute bony abnormality. Electronically Signed   By: Charlett Nose M.D.   On: 11/11/2019 17:26   DG Femur Min 2 Views Left  Result Date: 11/11/2019 CLINICAL DATA:  Unwitnessed fall. EXAM: LEFT FEMUR 2 VIEWS COMPARISON:  None. FINDINGS: Moderately displaced fracture is seen involving the intertrochanteric region of the proximal left femur. IMPRESSION: Moderately displaced proximal left femoral intertrochanteric fracture. Electronically Signed   By: Lupita Raider M.D.   On: 11/11/2019 17:11   DG Hips Bilat W or Wo Pelvis 5 Views  Result Date: 11/11/2019 CLINICAL DATA:  Unwitnessed fall. EXAM: DG HIP (WITH OR WITHOUT PELVIS) 5+V BILAT COMPARISON:  None. FINDINGS: Moderately displaced fracture is seen involving the intertrochanteric region of the proximal left femur. Right hip is unremarkable.  IMPRESSION: Moderately displaced proximal left femoral intertrochanteric fracture. Electronically Signed   By: Lupita Raider M.D.   On: 11/11/2019 17:10    Procedures Procedures (including critical care time)  Medications Ordered in ED Medications  Tdap (BOOSTRIX) injection 0.5 mL (has no administration in time range)  morphine 4 MG/ML injection 4 mg (has no administration in time range)  sodium chloride 0.9 % bolus 500 mL (has no administration in time range)   ED Course  I have reviewed the triage vital signs and the nursing notes.  Pertinent labs & imaging results that were available during my care of the patient were reviewed by me and considered in my medical decision making (see chart for details).  Patient with history of dementia presents for evaluation of unwitnessed fall at nursing facility.  Patient not provide history.  According to EMS she was complained of left hip pain.  Patient has varying stages of ecchymosis.  She has bent at bilateral knees.  She is neurovascularly intact.  No lacerations to suture.  No evidence of head trauma.  She is afebrile, nonseptic, not ill-appearing.  She is without tachycardia, tachypnea or or hypoxia.  Plan on labs, imaging and reassess.  Labs and imaging personally reviewed and interpreted Elbow without acute fractures Plain film femur left with moderately displaced femoral intertrochanteric fracture Chest x-ray without acute infiltrates, cardiomegaly or pulmonary edema CT head with age-indeterminate parietal infarct, Dr. Lynelle Doctor does not recommend further workup of this at this time. CT cervical spine WO acute findings CBC pending CK pending BMP pending COVID pending  Plan to consult with Ortho. Discussed with patient's medical power of attorney Arline Asp.  She does agree to surgery if needed.  She is available by telephone however will not be able to be here due to her work schedule. Patient is not followed by Orthopedics. NV intact.  Compartment soft.  CONSULT with Dr. Ave Filter with [facility]. Will plan to admit to medicine.  N.p.o. after midnight, likely surgery tomorrow  Care transferred to [place] who will follow up on labs and hospital admission.  The patient appears reasonably stabilized for admission considering the current resources, flow, and capabilities available in the ED at this time, and I doubt any other [facility] requiring further screening and/or treatment in the ED prior to admission.  Patient seen evaluated by attending, Dr. Lynelle Doctor  who agrees with above treatment, plan and disposition    MDM Rules/Calculators/A&P                           Final Clinical Impression(s) / ED Diagnoses Final diagnoses:  Fall, initial encounter  Closed displaced intertrochanteric fracture of left femur, initial encounter (HCC)  Dementia without behavioral disturbance, unspecified dementia type [facility])    Rx / DC Orders ED Discharge Orders    None       Acxel Dingee A, PA-C 11/11/19 1801    Linwood Dibbles, MD 11/13/19 [phone]

## 2019-11-11 NOTE — H&P (Signed)
History and Physical    Jillian Warth EXH:[id number] DOB: December 02, 1927 DOA: 11/11/2019  PCP: Laqueta Due., MD (Confirm with patient/family/NH records and if not entered, this has to be entered at [facility] point of entry) Patient coming from: SNF  I have personally briefly reviewed patient's old medical records in [facility]  Chief Complaint: leg pain after fall  HPI: Tina Stein is a 86 y.o. female with medical history significant of advanced dementia requiring care 24/7, hypothyroidism, HTN. At [facility] she was a non-ambulator but did propel herself in a w/c by shuffling her feet. She sustained a fall today when she tried to stand up from her wheel chair. She had immediate pain left leg and could not bear weight. She was transported to [facility] for evaluation.   ED Course: VSS. Neck and head CT negative. Hip film revealed displaced left intertrochanteric hip fracture. Dr. Ave Filter for ortho was consulted - he asked that [facility] admit and he will consult in AM  Review of Systems: Caveat - patient unable to give answers due to dementia   Past Medical History:  Diagnosis Date  . Dementia (HCC)   . Hearing loss   . HTN (hypertension)   . Hypothyroidism     No past surgical history on file.   reports previous alcohol use. She reports that she does not use drugs. No history on file for tobacco use.  No Known Allergies  No family history on file. Patient cannot give history.. Daughter reports that 5 of 8 female siblings died of dementia.   Prior to Admission medications   Medication Sig Start Date End Date Taking? Authorizing Provider  furosemide (LASIX) 20 MG tablet take 1 tablet by mouth once daily 07/16/16   [provider]  gabapentin (NEURONTIN) 100 MG capsule Take 100 mg by mouth at bedtime. 12/15/18   [provider]  levothyroxine (SYNTHROID) 50 MCG tablet take 1 tablet by mouth once daily 10/14/15   [provider]  ondansetron (ZOFRAN) 4 MG tablet Take  4 mg by mouth every 8 (eight) hours as needed for vomiting.  01/31/17   [provider]  timolol (TIMOPTIC) 0.5 % ophthalmic solution Place 1 drop into both eyes daily.  09/06/15   [provider]    Physical Exam: Vitals:   11/11/19 1914 11/11/19 1915 11/11/19 1930 11/11/19 1932  BP:  (!) 130/120 132/85   Pulse: 92 93 98 96  Resp: 10 18 15 15   Temp:      SpO2: 99% 100% 99% 93%    Constitutional: NAD, calm, comfortable Vitals:   11/11/19 1914 11/11/19 1915 11/11/19 1930 11/11/19 1932  BP:  (!) 130/120 132/85   Pulse: 92 93 98 96  Resp: 10 18 15 15   Temp:      SpO2: 99% 100% 99% 93%   General: elderly woman constantly phonating, mildly agitated Eyes: PERRL, lids and conjunctivae normal ENMT: Mucous membranes are moist. Posterior pharynx clear of any exudate or lesions.Normal dentition.  Neck: normal, supple, no masses, no thyromegaly Respiratory: clear to auscultation bilaterally, no wheezing, no crackles. Normal respiratory effort. No accessory muscle use.  Cardiovascular: Regular rate and rhythm, no murmurs / rubs / gallops. No extremity edema.  Abdomen: no tenderness, no masses palpated. No hepatosplenomegaly. Bowel sounds positive.  Musculoskeletal: no clubbing / cyanosis. No joint deformity upper  extremities. No rotation left leg. no contractures. Decreased muscle tone.  Skin: no rashes, lesions, ulcers. No induration. Mild brusing upper extremities. Sacrum not  visualized due to pain with movement Neurologic: CN 2-12 grossly intact.  Psychiatric: Non-communicator. Constant phonation - "lalalalalal." .     Labs on Admission: I have personally reviewed following labs and imaging studies  CBC: Recent Labs  Lab 11/11/19 1727  WBC 17.1*  NEUTROABS 15.2*  HGB 9.7*  HCT 31.2*  MCV 96.6  PLT 342   Basic Metabolic Panel: Recent Labs  Lab 11/11/19 1727  NA 139  K 4.0  CL 100  CO2 26  GLUCOSE 182*  BUN 36*  CREATININE 1.24*  CALCIUM 9.0    GFR: CrCl cannot be calculated (Unknown ideal weight.). Liver Function Tests: No results for input(s): AST, ALT, ALKPHOS, BILITOT, PROT, ALBUMIN in the last 168 hours. No results for input(s): LIPASE, AMYLASE in the last 168 hours. No results for input(s): AMMONIA in the last 168 hours. Coagulation Profile: No results for input(s): INR, PROTIME in the last 168 hours. Cardiac Enzymes: Recent Labs  Lab 11/11/19 1727  CKTOTAL 38   BNP (last 3 results) No results for input(s): PROBNP in the last 8760 hours. HbA1C: No results for input(s): HGBA1C in the last 72 hours. CBG: No results for input(s): GLUCAP in the last 168 hours. Lipid Profile: No results for input(s): CHOL, HDL, LDLCALC, TRIG, CHOLHDL, LDLDIRECT in the last 72 hours. Thyroid Function Tests: No results for input(s): TSH, T4TOTAL, FREET4, T3FREE, THYROIDAB in the last 72 hours. Anemia Panel: No results for input(s): VITAMINB12, FOLATE, FERRITIN, TIBC, IRON, RETICCTPCT in the last 72 hours. Urine analysis:    Component Value Date/Time   COLORURINE YELLOW 05/27/2019 1321   APPEARANCEUR CLEAR 05/27/2019 1321   LABSPEC 1.020 05/27/2019 1321   PHURINE 5.0 05/27/2019 1321   GLUCOSEU NEGATIVE 05/27/2019 1321   HGBUR SMALL (A) 05/27/2019 1321   BILIRUBINUR NEGATIVE 05/27/2019 1321   KETONESUR NEGATIVE 05/27/2019 1321   PROTEINUR 100 (A) 05/27/2019 1321   NITRITE NEGATIVE 05/27/2019 1321   LEUKOCYTESUR NEGATIVE 05/27/2019 1321    Radiological Exams on Admission: DG Chest 2 View  Result Date: 11/11/2019 CLINICAL DATA:  Unwitnessed fall. EXAM: CHEST - 2 VIEW COMPARISON:  May 27, 2019. FINDINGS: The heart size and mediastinal contours are within normal limits. Both lungs are clear. No pneumothorax or pleural effusion is noted. The visualized skeletal structures are unremarkable. IMPRESSION: No active cardiopulmonary disease. Aortic Atherosclerosis (ICD10-I70.0). Electronically Signed   By: Lupita Raider M.D.   On:  11/11/2019 17:08   DG Elbow Complete Left  Result Date: 11/11/2019 CLINICAL DATA:  Unwitnessed fall. EXAM: LEFT ELBOW - COMPLETE 3+ VIEW COMPARISON:  None. FINDINGS: There is no evidence of fracture, dislocation, or joint effusion. There is no evidence of arthropathy or other focal bone abnormality. Soft tissues are unremarkable. IMPRESSION: Negative. Electronically Signed   By: Lupita Raider M.D.   On: 11/11/2019 17:12   CT Head Wo Contrast  Result Date: 11/11/2019 CLINICAL DATA:  Unwitnessed fall EXAM: CT HEAD WITHOUT CONTRAST TECHNIQUE: Contiguous axial images were obtained from the base of the skull through the vertex without intravenous contrast. COMPARISON:  None. FINDINGS: Brain: Low-density area noted in the right parietal lobe compatible with infarct, age indeterminate. There is atrophy and chronic small vessel disease changes. No hemorrhage or hydrocephalus. Vascular: No hyperdense vessel or unexpected calcification. Skull: No acute calvarial abnormality. Sinuses/Orbits: Visualized paranasal sinuses and mastoids clear. Orbital soft tissues unremarkable. 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 Atrophy, chronic small vessel disease. Electronically Signed   By: Charlett Nose M.D.  On: 11/11/2019 17:24   CT Cervical Spine Wo Contrast  Result Date: 11/11/2019 CLINICAL DATA:  Neck pain EXAM: CT CERVICAL SPINE WITHOUT CONTRAST TECHNIQUE: Multidetector CT imaging of the cervical spine was performed without intravenous contrast. Multiplanar CT image reconstructions were also generated. COMPARISON:  None. FINDINGS: Alignment: Normal Skull base and vertebrae: Choose 1 Soft tissues and spinal canal: No prevertebral fluid or swelling. No visible canal hematoma. Disc levels: Degenerative disc disease with disc space narrowing and spurring at C6-7. Mild bilateral degenerative facet disease. Upper chest: No acute findings Other: None IMPRESSION: No acute bony abnormality.  Electronically Signed   By: Charlett Nose M.D.   On: 11/11/2019 17:26   DG Femur Min 2 Views Left  Result Date: 11/11/2019 CLINICAL DATA:  Unwitnessed fall. EXAM: LEFT FEMUR 2 VIEWS COMPARISON:  None. FINDINGS: Moderately displaced fracture is seen involving the intertrochanteric region of the proximal left femur. IMPRESSION: Moderately displaced proximal left femoral intertrochanteric fracture. Electronically Signed   By: Lupita Raider M.D.   On: 11/11/2019 17:11   DG Hips Bilat W or Wo Pelvis 5 Views  Result Date: 11/11/2019 CLINICAL DATA:  Unwitnessed fall. EXAM: DG HIP (WITH OR WITHOUT PELVIS) 5+V BILAT COMPARISON:  None. FINDINGS: Moderately displaced fracture is seen involving the intertrochanteric region of the proximal left femur. Right hip is unremarkable. IMPRESSION: Moderately displaced proximal left femoral intertrochanteric fracture. Electronically Signed   By: Lupita Raider M.D.   On: 11/11/2019 17:10    EKG: Independently reviewed. Sinus rhythm. Low voltage - poor study.  Assessment/Plan Active Problems:   Left displaced femoral neck fracture (HCC)   Dementia without behavioral disturbance (HCC)   Hypothyroidism   HTN (hypertension)     1. Ortho - patient with left intertrochanteric hip fracture. She was NOT an ambulator at [facility] but "scooted" along in her wheel chair. Plan Dr. Ave Filter for ortho to see in AM  NPO /p MN  Routine pain control  2. Dementia - advanced and progressive per daughter. Plan Ativan for agitation  Haldol if agitation no controlled by ativan  3. HTN - on diuretic alone and controlled. Will continue home meds  4. Hypothyroidism Plan Continue levothyroxine  TSH  5. Code status - long discussion with daughter (POA) - DNR  DVT prophylaxis: heparin  Code Status: DNR  Family Communication: Spoke at length with Merryl Hacker - daughter. Understands dx and plan for Ortho eval.   Disposition Plan: Return to NH when medically and surgically  stable Consults called: ortho - Dr. Ave Filter  Admission status: Inpatient    Illene Regulus MD [facility] Pager [phone]  If 7PM-7AM, please contact night-coverage [email or web address] Password [facility]  11/11/2019, 7:53 PM

## 2019-11-11 NOTE — ED Triage Notes (Signed)
Patient is from [facility] where she was found laying on the floor in the hall. Staff found her laying on her right side with her leg bent. She now complains of left leg pain with motion.     EMS vitals: 146/80 BP 70 HR 20 Resp Rate 98 SPO2 97.8 Temp 128 CBG

## 2019-11-11 NOTE — ED Provider Notes (Signed)
Dr. Ave Filter will see in am.  Labs obtained covid negative.  Hospitalist consulted for admission    Tina Stein 11/11/19 1845    Bethann Berkshire, MD 11/11/19 2208

## 2019-11-11 NOTE — Consult Note (Signed)
Reason for Consult: left hip injury  Referring Physician: Sammit  Tina Stein is an 86 y.o. female.  HPI: 86 yo female found on floor in hallway at [facility] facility. EMS called and taken to [facility] with complaint of L hip pain. Per ED note, communication with daughter reveals unknown baseline ambulatory status, has dementia. No known hip injury or sx in the past. No establish ortho provider.   No past medical history on file.  No past surgical history on file.  No family history on file.  Social History:  reports previous alcohol use. She reports that she does not use drugs. No history on file for tobacco use.  Allergies: No Known Allergies  Medications: I have reviewed the patient's current medications.  Results for orders placed or performed during the hospital encounter of 11/11/19 (from the past 48 hour(s))  CBC with Differential     Status: Abnormal   Collection Time: 11/11/19  5:27 PM  Result Value Ref Range   WBC 17.1 (H) 4.0 - 10.5 K/uL   RBC 3.23 (L) 3.87 - 5.11 MIL/uL   Hemoglobin 9.7 (L) 12.0 - 15.0 g/dL   HCT 53.6 (L) 36 - 46 %   MCV 96.6 80.0 - 100.0 fL   MCH 30.0 26.0 - 34.0 pg   MCHC 31.1 30.0 - 36.0 g/dL   RDW 14.4 31.5 - 40.0 %   Platelets 342 150 - 400 K/uL   nRBC 0.0 0.0 - 0.2 %   Neutrophils Relative % 89 %   Neutro Abs 15.2 (H) 1.7 - 7.7 K/uL   Lymphocytes Relative 6 %   Lymphs Abs 1.1 0.7 - 4.0 K/uL   Monocytes Relative 4 %   Monocytes Absolute 0.7 0 - 1 K/uL   Eosinophils Relative 0 %   Eosinophils Absolute 0.0 0 - 0 K/uL   Basophils Relative 0 %   Basophils Absolute 0.1 0 - 0 K/uL   Immature Granulocytes 1 %   Abs Immature Granulocytes 0.08 (H) 0.00 - 0.07 K/uL    Comment: Performed at [facility], [address], [place], [place] [address]  Basic metabolic panel     Status: Abnormal   Collection Time: 11/11/19  5:27 PM  Result Value Ref Range   Sodium 139 135 - 145 mmol/L   Potassium 4.0 3.5 - 5.1 mmol/L   Chloride 100 98  - 111 mmol/L   CO2 26 22 - 32 mmol/L   Glucose, Bld 182 (H) 70 - 99 mg/dL    Comment: Glucose reference range applies only to samples taken after fasting for at least 8 hours.   BUN 36 (H) 8 - 23 mg/dL   Creatinine, Ser 9.50 (H) 0.44 - 1.00 mg/dL   Calcium 9.0 8.9 - 93.2 mg/dL   GFR calc non Af Amer 38 (L) >60 mL/min   GFR calc Af Amer 45 (L) >60 mL/min   Anion gap 13 5 - 15    Comment: Performed at [facility], [address] [place], [place] [address]  CK     Status: None   Collection Time: 11/11/19  5:27 PM  Result Value Ref Range   Total CK 38 38.0 - 234.0 U/L    Comment: Performed at [facility], [address] [place], [place] [address]  SARS Coronavirus 2 by RT PCR (hospital order, performed in [facility] hospital lab) Nasopharyngeal Nasopharyngeal Swab     Status: None   Collection Time: 11/11/19  5:27  PM   Specimen: Nasopharyngeal Swab  Result Value Ref Range   SARS Coronavirus 2 NEGATIVE NEGATIVE    Comment: (NOTE) SARS-CoV-2 target nucleic acids are NOT DETECTED.  The SARS-CoV-2 RNA is generally detectable in upper and lower respiratory specimens during the acute phase of infection. The lowest concentration of SARS-CoV-2 viral copies this assay can detect is 250 copies / mL. A negative result does not preclude SARS-CoV-2 infection and should not be used as the sole basis for treatment or other patient management decisions.  A negative result may occur with improper specimen collection / handling, submission of specimen other than nasopharyngeal swab, presence of viral mutation(s) within the areas targeted by this assay, and inadequate number of viral copies (<250 copies / mL). A negative result must be combined with clinical observations, patient history, and epidemiological information.  Fact Sheet for Patients:   BoilerBrush.com.cy  Fact Sheet for Healthcare  Providers: https://pope.com/  This test is not yet approved or  cleared by the [place] FDA and has been authorized for detection and/or diagnosis of SARS-CoV-2 by FDA under an Emergency Use Authorization (EUA).  This EUA will remain in effect (meaning this test can be used) for the duration of the COVID-19 declaration under Section 564(b)(1) of the Act, 21 U.S.C. section 360bbb-3(b)(1), unless the authorization is terminated or revoked sooner.  Performed at [facility], [address], [place], [place] [address]     DG Chest 2 View  Result Date: 11/11/2019 CLINICAL DATA:  Unwitnessed fall. EXAM: CHEST - 2 VIEW COMPARISON:  May 27, 2019. FINDINGS: The heart size and mediastinal contours are within normal limits. Both lungs are clear. No pneumothorax or pleural effusion is noted. The visualized skeletal structures are unremarkable. IMPRESSION: No active cardiopulmonary disease. Aortic Atherosclerosis (ICD10-I70.0). Electronically Signed   By: Lupita Raider M.D.   On: 11/11/2019 17:08   DG Elbow Complete Left  Result Date: 11/11/2019 CLINICAL DATA:  Unwitnessed fall. EXAM: LEFT ELBOW - COMPLETE 3+ VIEW COMPARISON:  None. FINDINGS: There is no evidence of fracture, dislocation, or joint effusion. There is no evidence of arthropathy or other focal bone abnormality. Soft tissues are unremarkable. IMPRESSION: Negative. Electronically Signed   By: Lupita Raider M.D.   On: 11/11/2019 17:12   CT Head Wo Contrast  Result Date: 11/11/2019 CLINICAL DATA:  Unwitnessed fall EXAM: CT HEAD WITHOUT CONTRAST TECHNIQUE: Contiguous axial images were obtained from the base of the skull through the vertex without intravenous contrast. COMPARISON:  None. FINDINGS: Brain: Low-density area noted in the right parietal lobe compatible with infarct, age indeterminate. There is atrophy and chronic small vessel disease changes. No hemorrhage or hydrocephalus.  Vascular: No hyperdense vessel or unexpected calcification. Skull: No acute calvarial abnormality. Sinuses/Orbits: Visualized paranasal sinuses and mastoids clear. Orbital soft tissues unremarkable. 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 Atrophy, chronic small vessel disease. Electronically Signed   By: Charlett Nose M.D.   On: 11/11/2019 17:24   CT Cervical Spine Wo Contrast  Result Date: 11/11/2019 CLINICAL DATA:  Neck pain EXAM: CT CERVICAL SPINE WITHOUT CONTRAST TECHNIQUE: Multidetector CT imaging of the cervical spine was performed without intravenous contrast. Multiplanar CT image reconstructions were also generated. COMPARISON:  None. FINDINGS: Alignment: Normal Skull base and vertebrae: Choose 1 Soft tissues and spinal canal: No prevertebral fluid or swelling. No visible canal hematoma. Disc levels: Degenerative disc disease with disc space narrowing and spurring at C6-7. Mild bilateral degenerative facet disease. Upper chest: No acute  findings Other: None IMPRESSION: No acute bony abnormality. Electronically Signed   By: Charlett Nose M.D.   On: 11/11/2019 17:26   DG Femur Min 2 Views Left  Result Date: 11/11/2019 CLINICAL DATA:  Unwitnessed fall. EXAM: LEFT FEMUR 2 VIEWS COMPARISON:  None. FINDINGS: Moderately displaced fracture is seen involving the intertrochanteric region of the proximal left femur. IMPRESSION: Moderately displaced proximal left femoral intertrochanteric fracture. Electronically Signed   By: Lupita Raider M.D.   On: 11/11/2019 17:11   DG Hips Bilat W or Wo Pelvis 5 Views  Result Date: 11/11/2019 CLINICAL DATA:  Unwitnessed fall. EXAM: DG HIP (WITH OR WITHOUT PELVIS) 5+V BILAT COMPARISON:  None. FINDINGS: Moderately displaced fracture is seen involving the intertrochanteric region of the proximal left femur. Right hip is unremarkable. IMPRESSION: Moderately displaced proximal left femoral intertrochanteric fracture. Electronically Signed   By: Lupita Raider M.D.   On: 11/11/2019 17:10    Review of Systems  Unable to perform ROS: Dementia   Blood pressure (!) 116/103, pulse 100, temperature (!) 97.3 F (36.3 C), resp. rate 20, SpO2 99 %. Physical Exam Constitutional:      Appearance: Normal appearance. She is normal weight.  HENT:     Head: Atraumatic.     Right Ear: External ear normal.     Left Ear: External ear normal.     Nose: Nose normal.  Eyes:     Extraocular Movements: Extraocular movements intact.  Cardiovascular:     Rate and Rhythm: Normal rate.     Pulses: Normal pulses.  Pulmonary:     Effort: Pulmonary effort is normal.  Musculoskeletal:     Cervical back: Normal range of motion.     Comments: L hip shortened and externally rotated Pain w ROM hip nontender knee and other LE  Distally grossly NVI  Neurological:     Mental Status: She is alert.     Assessment/Plan: L femoral intertroch fx rec sx for comfort and potential to weight bear in the future if able Npo after midnight, plan for IM nail in the am  Dr. Ave Filter to discuss sx, risks and benefits with patients daughter over the phone. Has already been agreeable to sx according to ED notes.   Jiles Harold 11/11/2019, 7:05 PM

## 2019-11-12 ENCOUNTER — Inpatient Hospital Stay (HOSPITAL_COMMUNITY): Payer: Medicare Other | Admitting: Certified Registered Nurse Anesthetist

## 2019-11-12 ENCOUNTER — Encounter (HOSPITAL_COMMUNITY)
Admission: EM | Disposition: A | Discharge status: Skilled Nursing Facility | Payer: Self-pay | Source: Skilled Nursing Facility | Attending: Internal Medicine

## 2019-11-12 ENCOUNTER — Inpatient Hospital Stay (HOSPITAL_COMMUNITY): Payer: Medicare Other

## 2019-11-12 ENCOUNTER — Encounter (HOSPITAL_COMMUNITY): Payer: Self-pay | Admitting: Internal Medicine

## 2019-11-12 HISTORY — PX: FEMUR IM NAIL: SHX1597

## 2019-11-12 LAB — SURGICAL PCR SCREEN
MRSA, PCR: POSITIVE — AB
Staphylococcus aureus: POSITIVE — AB

## 2019-11-12 LAB — ABO/RH: ABO/RH(D): B NEG

## 2019-11-12 SURGERY — INSERTION, INTRAMEDULLARY ROD, FEMUR
Anesthesia: General | Laterality: Left

## 2019-11-12 MED ORDER — FENTANYL CITRATE (PF) 100 MCG/2ML IJ SOLN
INTRAMUSCULAR | Status: DC | PRN
  Administered 2019-11-12 (×2): 25 ug via INTRAVENOUS
  Administered 2019-11-12: 50 ug via INTRAVENOUS

## 2019-11-12 MED ORDER — ONDANSETRON HCL 4 MG/2ML IJ SOLN: 4.0000 mg | Freq: Four times a day (QID) | INTRAMUSCULAR | Status: DC | PRN

## 2019-11-12 MED ORDER — PHENYLEPHRINE 40 MCG/ML (10ML) SYRINGE FOR IV PUSH (FOR BLOOD PRESSURE SUPPORT)
PREFILLED_SYRINGE | INTRAVENOUS | Status: DC | PRN
  Administered 2019-11-12 (×4): 80 ug via INTRAVENOUS

## 2019-11-12 MED ORDER — DEXAMETHASONE SODIUM PHOSPHATE 10 MG/ML IJ SOLN
INTRAMUSCULAR | Status: DC | PRN
  Administered 2019-11-12: 4 mg via INTRAVENOUS

## 2019-11-12 MED ORDER — FERROUS SULFATE 325 (65 FE) MG PO TABS
325.0000 mg | ORAL_TABLET | Freq: Three times a day (TID) | ORAL | Status: DC
  Administered 2019-11-13 – 2019-11-16 (×9): 325 mg via ORAL
  Filled 2019-11-12 (×10): qty 1

## 2019-11-12 MED ORDER — GABAPENTIN 100 MG PO CAPS
100.0000 mg | ORAL_CAPSULE | Freq: Every day | ORAL | Status: DC
  Administered 2019-11-13 – 2019-11-16 (×3): 100 mg via ORAL
  Filled 2019-11-12 (×3): qty 1

## 2019-11-12 MED ORDER — ACETAMINOPHEN 500 MG PO TABS
500.0000 mg | ORAL_TABLET | Freq: Four times a day (QID) | ORAL | Status: AC
  Administered 2019-11-13: 500 mg via ORAL
  Filled 2019-11-12: qty 1

## 2019-11-12 MED ORDER — LIDOCAINE 2% (20 MG/ML) 5 ML SYRINGE
INTRAMUSCULAR | Status: AC
  Filled 2019-11-12: qty 5

## 2019-11-12 MED ORDER — MUPIROCIN 2 % EX OINT
1.0000 "application " | TOPICAL_OINTMENT | Freq: Two times a day (BID) | CUTANEOUS | Status: DC
  Administered 2019-11-12 – 2019-11-16 (×8): 1 via NASAL
  Filled 2019-11-12: qty 22

## 2019-11-12 MED ORDER — FENTANYL CITRATE (PF) 100 MCG/2ML IJ SOLN: 25.0000 ug | INTRAMUSCULAR | Status: DC | PRN

## 2019-11-12 MED ORDER — ALUM & MAG HYDROXIDE-SIMETH 200-200-20 MG/5ML PO SUSP: 30.0000 mL | ORAL | Status: DC | PRN

## 2019-11-12 MED ORDER — BISACODYL 10 MG RE SUPP: 10.0000 mg | Freq: Every day | RECTAL | Status: DC | PRN

## 2019-11-12 MED ORDER — FENTANYL CITRATE (PF) 100 MCG/2ML IJ SOLN
INTRAMUSCULAR | Status: AC
  Filled 2019-11-12: qty 2

## 2019-11-12 MED ORDER — TRAMADOL HCL 50 MG PO TABS
50.0000 mg | ORAL_TABLET | Freq: Four times a day (QID) | ORAL | Status: DC | PRN
  Administered 2019-11-13 – 2019-11-14 (×3): 50 mg via ORAL
  Filled 2019-11-12 (×3): qty 1

## 2019-11-12 MED ORDER — LIDOCAINE 2% (20 MG/ML) 5 ML SYRINGE
INTRAMUSCULAR | Status: DC | PRN
  Administered 2019-11-12: 60 mg via INTRAVENOUS

## 2019-11-12 MED ORDER — ENOXAPARIN SODIUM 30 MG/0.3ML ~~LOC~~ SOLN: 30.0000 mg | SUBCUTANEOUS | Status: DC

## 2019-11-12 MED ORDER — PROPOFOL 10 MG/ML IV BOLUS
INTRAVENOUS | Status: DC | PRN
  Administered 2019-11-12: 130 mg via INTRAVENOUS

## 2019-11-12 MED ORDER — POVIDONE-IODINE 10 % EX SWAB
2.0000 "application " | Freq: Once | CUTANEOUS | Status: AC
  Administered 2019-11-12: 2 via TOPICAL

## 2019-11-12 MED ORDER — TIMOLOL MALEATE 0.5 % OP SOLN
1.0000 [drp] | Freq: Every day | OPHTHALMIC | Status: DC
  Administered 2019-11-12 – 2019-11-16 (×4): 1 [drp] via OPHTHALMIC
  Filled 2019-11-12: qty 5

## 2019-11-12 MED ORDER — LACTATED RINGERS IV SOLN: INTRAVENOUS | Status: DC | PRN

## 2019-11-12 MED ORDER — ONDANSETRON HCL 4 MG PO TABS: 4.0000 mg | ORAL_TABLET | Freq: Four times a day (QID) | ORAL | Status: DC | PRN

## 2019-11-12 MED ORDER — SENNA 8.6 MG PO TABS
1.0000 | ORAL_TABLET | Freq: Two times a day (BID) | ORAL | Status: DC
  Administered 2019-11-13 – 2019-11-16 (×6): 8.6 mg via ORAL
  Filled 2019-11-12 (×6): qty 1

## 2019-11-12 MED ORDER — ACETAMINOPHEN 325 MG PO TABS: 325.0000 mg | ORAL_TABLET | Freq: Four times a day (QID) | ORAL | Status: DC | PRN

## 2019-11-12 MED ORDER — MENTHOL 3 MG MT LOZG: 1.0000 | LOZENGE | OROMUCOSAL | Status: DC | PRN

## 2019-11-12 MED ORDER — DOCUSATE SODIUM 100 MG PO CAPS
100.0000 mg | ORAL_CAPSULE | Freq: Two times a day (BID) | ORAL | Status: DC
  Administered 2019-11-13 – 2019-11-16 (×6): 100 mg via ORAL
  Filled 2019-11-12 (×6): qty 1

## 2019-11-12 MED ORDER — MAGNESIUM CITRATE PO SOLN: 1.0000 | Freq: Once | ORAL | Status: DC | PRN

## 2019-11-12 MED ORDER — PHENOL 1.4 % MT LIQD: 1.0000 | OROMUCOSAL | Status: DC | PRN

## 2019-11-12 MED ORDER — ONDANSETRON HCL 4 MG/2ML IJ SOLN: 4.0000 mg | Freq: Once | INTRAMUSCULAR | Status: DC | PRN

## 2019-11-12 MED ORDER — PHENYLEPHRINE HCL-NACL 10-0.9 MG/250ML-% IV SOLN
INTRAVENOUS | Status: DC | PRN
Start: 2019-11-12 — End: 2019-11-12
  Administered 2019-11-12: 50 ug/min via INTRAVENOUS

## 2019-11-12 MED ORDER — DEXAMETHASONE SODIUM PHOSPHATE 10 MG/ML IJ SOLN
INTRAMUSCULAR | Status: AC
  Filled 2019-11-12: qty 1

## 2019-11-12 MED ORDER — FUROSEMIDE 20 MG PO TABS
20.0000 mg | ORAL_TABLET | Freq: Every day | ORAL | Status: DC
  Administered 2019-11-13 – 2019-11-16 (×3): 20 mg via ORAL
  Filled 2019-11-12 (×4): qty 1

## 2019-11-12 MED ORDER — LEVOTHYROXINE SODIUM 50 MCG PO TABS
50.0000 ug | ORAL_TABLET | Freq: Every day | ORAL | Status: DC
  Administered 2019-11-14 – 2019-11-16 (×3): 50 ug via ORAL
  Filled 2019-11-12 (×3): qty 1

## 2019-11-12 MED ORDER — ONDANSETRON HCL 4 MG/2ML IJ SOLN
INTRAMUSCULAR | Status: AC
  Filled 2019-11-12: qty 2

## 2019-11-12 MED ORDER — ORAL CARE MOUTH RINSE
15.0000 mL | Freq: Two times a day (BID) | OROMUCOSAL | Status: DC
  Administered 2019-11-13 – 2019-11-16 (×5): 15 mL via OROMUCOSAL

## 2019-11-12 MED ORDER — VITAMIN D 25 MCG (1000 UNIT) PO TABS
2000.0000 [IU] | ORAL_TABLET | Freq: Every day | ORAL | Status: DC
  Administered 2019-11-13 – 2019-11-16 (×4): 2000 [IU] via ORAL
  Filled 2019-11-12 (×4): qty 2

## 2019-11-12 MED ORDER — POLYETHYLENE GLYCOL 3350 17 G PO PACK: 17.0000 g | PACK | Freq: Every day | ORAL | Status: DC | PRN

## 2019-11-12 MED ORDER — PROPOFOL 10 MG/ML IV BOLUS
INTRAVENOUS | Status: AC
  Filled 2019-11-12: qty 20

## 2019-11-12 MED ORDER — ONDANSETRON HCL 4 MG/2ML IJ SOLN
INTRAMUSCULAR | Status: DC | PRN
  Administered 2019-11-12: 4 mg via INTRAVENOUS

## 2019-11-12 MED ORDER — EPHEDRINE SULFATE-NACL 50-0.9 MG/10ML-% IV SOSY
PREFILLED_SYRINGE | INTRAVENOUS | Status: DC | PRN
  Administered 2019-11-12 (×3): 10 mg via INTRAVENOUS

## 2019-11-12 MED ORDER — VANCOMYCIN HCL IN DEXTROSE 1-5 GM/200ML-% IV SOLN
1000.0000 mg | Freq: Once | INTRAVENOUS | Status: AC
  Administered 2019-11-12: 1000 mg via INTRAVENOUS

## 2019-11-12 MED ORDER — POTASSIUM CHLORIDE IN NACL 20-0.45 MEQ/L-% IV SOLN
INTRAVENOUS | Status: DC
  Filled 2019-11-12 (×4): qty 1000

## 2019-11-12 MED ORDER — LACTATED RINGERS IV SOLN: INTRAVENOUS | Status: DC

## 2019-11-12 MED ORDER — CHLORHEXIDINE GLUCONATE CLOTH 2 % EX PADS
6.0000 | MEDICATED_PAD | Freq: Every day | CUTANEOUS | Status: DC
  Administered 2019-11-12: 6 via TOPICAL

## 2019-11-12 MED ORDER — CEFAZOLIN SODIUM-DEXTROSE 2-4 GM/100ML-% IV SOLN
2.0000 g | Freq: Four times a day (QID) | INTRAVENOUS | Status: AC
  Administered 2019-11-12 – 2019-11-13 (×2): 2 g via INTRAVENOUS
  Filled 2019-11-12 (×2): qty 100

## 2019-11-12 MED ORDER — VANCOMYCIN HCL IN DEXTROSE 1-5 GM/200ML-% IV SOLN
INTRAVENOUS | Status: AC
  Filled 2019-11-12: qty 200

## 2019-11-12 SURGICAL SUPPLY — 44 items
BAG ZIPLOCK 12X15 (MISCELLANEOUS) IMPLANT
BIT DRILL CANN LG 4.3MM (BIT) ×1 IMPLANT
BNDG COHESIVE 4X5 TAN STRL (GAUZE/BANDAGES/DRESSINGS) ×3 IMPLANT
BNDG GAUZE ELAST 4 BULKY (GAUZE/BANDAGES/DRESSINGS) ×3 IMPLANT
CLOSURE WOUND 1/2 X4 (GAUZE/BANDAGES/DRESSINGS) ×1
COVER PERINEAL POST (MISCELLANEOUS) ×3 IMPLANT
COVER SURGICAL LIGHT HANDLE (MISCELLANEOUS) ×3 IMPLANT
COVER WAND RF STERILE (DRAPES) IMPLANT
DRAPE STERI IOBAN 125X83 (DRAPES) ×3 IMPLANT
DRILL BIT CANN LG 4.3MM (BIT) ×3
DRSG MEPILEX BORDER 4X4 (GAUZE/BANDAGES/DRESSINGS) ×6 IMPLANT
DRSG MEPILEX BORDER 4X8 (GAUZE/BANDAGES/DRESSINGS) ×3 IMPLANT
DURAPREP 26ML APPLICATOR (WOUND CARE) ×3 IMPLANT
ELECT REM PT RETURN 15FT ADLT (MISCELLANEOUS) ×3 IMPLANT
GAUZE SPONGE 4X4 12PLY STRL (GAUZE/BANDAGES/DRESSINGS) ×3 IMPLANT
GAUZE XEROFORM 5X9 LF (GAUZE/BANDAGES/DRESSINGS) ×3 IMPLANT
GLOVE BIO SURGEON STRL SZ7 (GLOVE) ×3 IMPLANT
GLOVE BIOGEL PI IND STRL 7.0 (GLOVE) ×1 IMPLANT
GLOVE BIOGEL PI IND STRL 8 (GLOVE) ×1 IMPLANT
GLOVE BIOGEL PI INDICATOR 7.0 (GLOVE) ×2
GLOVE BIOGEL PI INDICATOR 8 (GLOVE) ×2
GLOVE ORTHO TXT STRL SZ7.5 (GLOVE) ×18 IMPLANT
GOWN STRL REUS W/TWL LRG LVL3 (GOWN DISPOSABLE) ×9 IMPLANT
GUIDEPIN 3.2X17.5 THRD DISP (PIN) ×3 IMPLANT
HIP FRA NAIL LAG SCREW 10.5X90 (Orthopedic Implant) ×3 IMPLANT
KIT BASIN (CUSTOM PROCEDURE TRAY) ×3 IMPLANT
KIT TURNOVER KIT A (KITS) ×3 IMPLANT
NAIL HIP FRACT 130D 11X180 (Screw) ×3 IMPLANT
NS IRRIG 1000ML POUR BTL (IV SOLUTION) ×3 IMPLANT
PACK GENERAL/GYN (CUSTOM PROCEDURE TRAY) ×3 IMPLANT
PROTECTOR NERVE ULNAR (MISCELLANEOUS) ×6 IMPLANT
SCREW BONE CORTICAL 5.0X36 (Screw) ×3 IMPLANT
SCREW LAG HIP FRA NAIL 10.5X90 (Orthopedic Implant) ×1 IMPLANT
STAPLER VISISTAT 35W (STAPLE) IMPLANT
STRIP CLOSURE SKIN 1/2X4 (GAUZE/BANDAGES/DRESSINGS) ×2 IMPLANT
SUT MNCRL AB 4-0 PS2 18 (SUTURE) ×3 IMPLANT
SUT VIC AB 0 CT1 27 (SUTURE)
SUT VIC AB 0 CT1 27XBRD ANTBC (SUTURE) IMPLANT
SUT VIC AB 2-0 CT1 27 (SUTURE) ×2
SUT VIC AB 2-0 CT1 TAPERPNT 27 (SUTURE) ×1 IMPLANT
SUT VIC AB 3-0 SH 27 (SUTURE) ×2
SUT VIC AB 3-0 SH 27X BRD (SUTURE) ×1 IMPLANT
TOWEL OR 17X26 10 PK STRL BLUE (TOWEL DISPOSABLE) ×3 IMPLANT
WATER STERILE IRR 1000ML POUR (IV SOLUTION) ×3 IMPLANT

## 2019-11-12 NOTE — Op Note (Signed)
DATE OF SURGERY:  11/12/2019  TIME: 2:36 PM  PATIENT NAME:  Tina Stein  AGE: 86 y.o.  PRE-OPERATIVE DIAGNOSIS:  LEFT INTERTROCHANTERIC  HIP FRACTURE  POST-OPERATIVE DIAGNOSIS:  SAME  PROCEDURE:  INTRAMEDULLARY (IM) NAIL FEMORAL  SURGEON:  Eulas Post  ASSISTANT:  Janine Ores, PA-C, present and scrubbed throughout the case, critical for assistance with exposure, retraction, instrumentation, and closure.  OPERATIVE IMPLANTS: Biomet Affixus size 180 x 11 femoral nail with a cephalomedullary lag screw for the femoral head, and a size 36 distal interlocking bolt.  UNIQUE ASPECTS OF THE CASE:  Difficult basicervical pattern with apex anterior, difficult to reduce, used a cobb to reduce the neck.  ESTIMATED BLOOD LOSS: 100 ml  PREOPERATIVE INDICATIONS:  [name] is a 86 y.o. year old who fell and suffered a hip fracture. She was brought into the ER and then admitted and optimized and then elected for surgical intervention.    The risks benefits and alternatives were discussed with the patient including but not limited to the risks of nonoperative treatment, versus surgical intervention including infection, bleeding, nerve injury, malunion, nonunion, hardware prominence, hardware failure, need for hardware removal, blood clots, cardiopulmonary complications, morbidity, mortality, among others, and they were willing to proceed.    OPERATIVE PROCEDURE:  The patient was brought to the operating room and placed in the supine position. Anesthesia was administered. She was placed on the fracture table.  Closed reduction was performed under C-arm guidance.  Time out was then performed after sterile prep and drape. She received preoperative antibiotics.  Incision was made proximal to the greater trochanter. A guidewire was placed in the appropriate position. Confirmation was made on AP and lateral views.  The above-named nail was opened. I opened the proximal femur with a reamer. I then  placed the nail by hand down. I did not need to ream the femur.  Once the nail was completely seated, I placed a guidepin into the femoral head into the center center position.  I had to extend the incision to allow introduction of a cobb to reduce the neck a little better, and then used the jig to manipulate the lateral segment in line with the neck.   I measured the length, and then reamed the lateral cortex and up into the head. I then placed the cephalomedullary screw. Slight compression was applied. Anatomic fixation achieved. Bone quality was mediocre.  I then secured the proximal interlocking bolt, and locked the nail distally using the jig.  I took final C-arm pictures AP and lateral.   Anatomic reconstruction was achieved, and the wounds were irrigated copiously and closed with Vicryl followed by Steri-Strips and sterile gauze for the skin. The patient was awakened and returned to PACU in stable and satisfactory condition. There were no complications and the patient tolerated the procedure well.  She will be weightbearing as tolerated, and will be on Lovenox  for a period of four weeks after discharge.   Teryl Lucy, M.D.

## 2019-11-12 NOTE — Plan of Care (Signed)
  Problem: Safety: Goal: Ability to remain free from injury will improve Outcome: Progressing  Pt confused with dementia

## 2019-11-12 NOTE — Anesthesia Preprocedure Evaluation (Addendum)
Anesthesia Evaluation  Patient identified by MRN, date of birth, ID band Patient awake and Patient confused    Reviewed: Allergy & Precautions, NPO status , Patient's Chart, lab work & pertinent test results  Airway Mallampati: III  TM Distance: >3 FB Neck ROM: Limited    Dental  (+) Poor Dentition   Pulmonary neg pulmonary ROS,    Pulmonary exam normal breath sounds clear to auscultation       Cardiovascular hypertension, Pt. on medications Normal cardiovascular exam Rhythm:Regular Rate:Normal     Neuro/Psych PSYCHIATRIC DISORDERS Dementia Glaucoma    GI/Hepatic negative GI ROS, Neg liver ROS,   Endo/Other  Hypothyroidism   Renal/GU Renal InsufficiencyRenal disease  negative genitourinary   Musculoskeletal Left intertrochanteric hip Fx   Abdominal   Peds  Hematology  (+) anemia ,   Anesthesia Other Findings   Reproductive/Obstetrics                           Anesthesia Physical Anesthesia Plan  ASA: III  Anesthesia Plan: General   Post-op Pain Management:    Induction: Intravenous  PONV Risk Score and Plan: 4 or greater and Treatment may vary due to age or medical condition and Ondansetron  Airway Management Planned: LMA  Additional Equipment:   Intra-op Plan:   Post-operative Plan: Extubation in OR  Informed Consent: I have reviewed the patients History and Physical, chart, labs and discussed the procedure including the risks, benefits and alternatives for the proposed anesthesia with the patient or authorized representative who has indicated his/her understanding and acceptance.   Patient has DNR.  Discussed DNR with patient and Suspend DNR.   Dental advisory given  Plan Discussed with: CRNA and Anesthesiologist  Anesthesia Plan Comments:         Anesthesia Quick Evaluation

## 2019-11-12 NOTE — Anesthesia Postprocedure Evaluation (Signed)
Anesthesia Post Note  Patient: Tina Stein  Procedure(s) Performed: INTRAMEDULLARY (IM) NAIL FEMORAL (Left )     Patient location during evaluation: PACU Anesthesia Type: General Level of consciousness: awake and alert, oriented and patient cooperative Pain management: pain level controlled Vital Signs Assessment: post-procedure vital signs reviewed and stable Respiratory status: spontaneous breathing, nonlabored ventilation and respiratory function stable Cardiovascular status: blood pressure returned to baseline and stable Postop Assessment: no apparent nausea or vomiting Anesthetic complications: no   No complications documented.  Last Vitals:  Vitals:   11/12/19 1530 11/12/19 1545  BP: 112/86 (!) 107/50  Pulse: 70 78  Resp: 15 (!) 24  Temp:    SpO2: 100% 100%    Last Pain:  Vitals:   11/12/19 1515  TempSrc:   PainSc: Asleep                 Lannie Fields

## 2019-11-12 NOTE — Progress Notes (Signed)
I was asked to assist Dr. Jones Broom with management of her care in order to expedite surgical intervention.    The patient has been re-examined, and the chart reviewed, and there have been no interval changes to the documented history and physical.    The risks benefits and alternatives were discussed with the patient and her daughter including but not limited to the risks of nonoperative treatment, versus surgical intervention including infection, bleeding, nerve injury, malunion, nonunion, the need for revision surgery, hardware prominence, hardware failure, the need for hardware removal, blood clots, cardiopulmonary complications, morbidity, mortality, among others, and they were willing to proceed.    Plan for left hip intramedullary nail fixation.  This will be for the treatment of a intertrochanteric hip fracture, the patient is nonambulatory.  Teryl Lucy, MD

## 2019-11-12 NOTE — Progress Notes (Signed)
Initial Nutrition Assessment  DOCUMENTATION CODES:   Not applicable  INTERVENTION:  - diet advancement as medically feasible. - weigh patient today.   NUTRITION DIAGNOSIS:   Inadequate oral intake related to inability to eat as evidenced by NPO status.  GOAL:   Patient will meet greater than or equal to 90% of their needs  MONITOR:   Diet advancement, Labs, Weight trends  REASON FOR ASSESSMENT:   Malnutrition Screening Tool  ASSESSMENT:   86 y.o. female with medical history of advanced dementia requiring care 24/7, hypothyroidism, and HTN. She resides at [facility] and is non-ambulator but could propel herself in a wheelchair by shuffling her feet. She sustained a fall while trying to stand up from her wheel chair. She had immediate L leg pain and could not bear weight. She was transported to [facility] ED for evaluation.  She has been NPO since admission. No weight information available in the chart since 05/27/19. Flow sheet information indicates she is disoriented x4.   Per notes: - L hip fx--plan for surgery today (7/1) - advanced/progressive dementia   Labs reviewed; BUN: 36 mg/dl, creatinine: 5.73 mg/dl, GFR: 38 ml/min. Medications reviewed. IVF; D5-1/2 NS @ 50 ml/hr (204 kcal).    NUTRITION - FOCUSED PHYSICAL EXAM:  unable to complete at this time.   Diet Order:   Diet Order            Diet NPO time specified Except for: Ice Chips, Sips with Meds  Diet effective midnight                 EDUCATION NEEDS:   No education needs have been identified at this time  Skin:  Skin Assessment: Reviewed RN Assessment  Last BM:  6/30  Height:   Ht Readings from Last 1 Encounters:  05/27/19 5\' 4"  (1.626 m)    Weight:   Wt Readings from Last 1 Encounters:  05/27/19 68.4 kg    Estimated Nutritional Needs:  Kcal:  unable to calculate Protein:  unable to calculate Fluid:  unable to calculate     05/29/19, MS, RD, LDN, CNSC Inpatient Clinical  Dietitian RD pager # available in [facility]  After hours/weekend pager # available in [facility]

## 2019-11-12 NOTE — Transfer of Care (Signed)
Immediate Anesthesia Transfer of Care Note  Patient: Tina Stein  Procedure(s) Performed: Procedure(s): INTRAMEDULLARY (IM) NAIL FEMORAL (Left)  Patient Location: PACU  Anesthesia Type:General  Level of Consciousness: Alert, Awake, Oriented  Airway & Oxygen Therapy: Patient Spontanous Breathing  Post-op Assessment: Report given to RN  Post vital signs: Reviewed and stable  Last Vitals:  Vitals:   11/12/19 1022 11/12/19 1219  BP: 128/66 126/67  Pulse: 74 73  Resp: 20 16  Temp: 36.9 C 36.7 C  SpO2: 100% 98%    Complications: No apparent anesthesia complications

## 2019-11-12 NOTE — Progress Notes (Signed)
Patient arrives for surgery from 1327. Unable to answer any questions . Her daughter gave verbal consent via telephone to RN on 3 [address]. She is very agitated picking at IV in right forearm. Talked to Dr Malen Gauze about 22 gauge angio cath which works fine. Will sedate patient prior to surgery to place a larger angio cath due to agitation.

## 2019-11-12 NOTE — Discharge Instructions (Signed)
.  Diet: As you were doing prior to hospitalization   Shower:  May shower but keep the wounds dry, use an occlusive plastic wrap, NO SOAKING IN TUB.  If the bandage gets wet, change with a clean dry gauze.  If you have a splint on, leave the splint in place and keep the splint dry with a plastic bag.  Dressing:  You may change your dressing 3-5 days after surgery, unless you have a splint.  If you have a splint, then just leave the splint in place and we will change your bandages during your first follow-up appointment.    If you had hand or foot surgery, we will plan to remove your stitches in about 2 weeks in the office.  For all other surgeries, there are sticky tapes (steri-strips) on your wounds and all the stitches are absorbable.  Leave the steri-strips in place when changing your dressings, they will peel off with time, usually 2-3 weeks.  Activity:  Increase activity slowly as tolerated, but follow the weight bearing instructions below.  The rules on driving is that you can not be taking narcotics while you drive, and you must feel in control of the vehicle.    Weight Bearing:   WBAT LLE  To prevent constipation: you may use a stool softener such as -  Colace (over the counter) 100 mg by mouth twice a day  Drink plenty of fluids (prune juice may be helpful) and high fiber foods Miralax (over the counter) for constipation as needed.    Itching:  If you experience itching with your medications, try taking only a single pain pill, or even half a pain pill at a time.  You may take up to 10 pain pills per day, and you can also use benadryl over the counter for itching or also to help with sleep.   Precautions:  If you experience chest pain or shortness of breath - call 911 immediately for transfer to the hospital emergency department!!  If you develop a fever greater that 101 F, purulent drainage from wound, increased redness or drainage from wound, or calf pain -- Call the office at  [phone]                                                Follow- Up Appointment:  Please call for an appointment to be seen in 2 weeks [place] - [phone]

## 2019-11-12 NOTE — Anesthesia Procedure Notes (Signed)
Procedure Name: LMA Insertion Date/Time: 11/12/2019 1:19 PM Performed by: Lorelee Market, CRNA Pre-anesthesia Checklist: Patient identified, Emergency Drugs available, Suction available and Patient being monitored Patient Re-evaluated:Patient Re-evaluated prior to induction Oxygen Delivery Method: Circle system utilized Preoxygenation: Pre-oxygenation with 100% oxygen Induction Type: IV induction LMA: LMA inserted LMA Size: 3.0 Number of attempts: 1 Placement Confirmation: positive ETCO2 and breath sounds checked- equal and bilateral Tube secured with: Tape Dental Injury: Teeth and Oropharynx as per pre-operative assessment

## 2019-11-12 NOTE — Progress Notes (Signed)
PROGRESS NOTE    Tina GongBetty Stein  ZOX:[id number] DOB: 11/16/1927 DOA: 11/11/2019 PCP: Laqueta DueFurr, Sara M., MD   Chef Complaints: Left hip pain  Brief Narrative: 86 year old female with history of advanced dementia requiring 24/7 care, hypothyroidism, hypertension, [facility] resident who was admitted with fall and left hip fracture. Patient is nonambulatory but did propel herself in a wheelchair by shuffling her feet who sustained a fall at the nursing home and sent to the ED. In the ED vitals stable neck and head CT negative, x-ray showed displaced left intertrochanteric fracture, orthopedic was consulted and patient is admitted.  Subjective: Patient is able to tell me her name otherwise not able to communicate any other needs. Mumbling words.  Assessment & Plan:  Left intertrochanteric hip fracture secondary to fall. Patient is not ambulatory at nursing home but "scooted along in wheel chair". Seen by orthopedic and going for operative intervention. Continue DVT prophylaxis pain control PT OT Foley care as per orthopedics. Stable. CBC hemoglobin is stable.  Advanced dementia patient alert awake oriented to self. Provide supportive care fall precaution. Watch for delirium can use as needed Ativan/Haldol.  Essential hypertension on diuretic alone, blood pressure is controlled. Monitor  Hypothyroidism continue levothyroxine.  DVT prophylaxis: heparin injection 5,000 Units Start: 11/11/19 2200 Code Status: DNR Family Communication: plan of care discussed with patient at bedside.  Status is: Inpatient  Remains inpatient appropriate because:Ongoing active pain requiring inpatient pain management and For operative intervention of the left hip fracture   Dispo: The patient is from: SNF              Anticipated d/c is to: SNF              Anticipated d/c date is: 2 days              Patient currently is not medically stable to d/c.   Nutrition: Diet Order            Diet  NPO time specified Except for: Ice Chips, Sips with Meds  Diet effective midnight                 Nutrition Problem: Inadequate oral intake Etiology: inability to eat Signs/Symptoms: NPO status Interventions: Refer to RD note for recommendations Body mass index is 22.14 kg/m.  Consultants:see note  Procedures:see note Microbiology:see note  Medications: Scheduled Meds: . [MAR Hold] acetaminophen  650 mg Oral Q6H  . [MAR Hold] Chlorhexidine Gluconate Cloth  6 each Topical Q0600  . [MAR Hold] heparin  5,000 Units Subcutaneous Q8H  . [MAR Hold] mouth rinse  15 mL Mouth Rinse BID  . [MAR Hold] mupirocin ointment  1 application Nasal BID   Continuous Infusions: .  ceFAZolin (ANCEF) IV    . dextrose 5 % and 0.45% NaCl 50 mL/hr at 11/12/19 0200  . lactated ringers 50 mL/hr at 11/12/19 1222  . vancomycin    . vancomycin      Antimicrobials: Anti-infectives (From admission, onward)   Start     Dose/Rate Route Frequency Ordered Stop   11/12/19 1315  vancomycin (VANCOCIN) IVPB 1000 mg/200 mL premix     Discontinue     1,000 mg 200 mL/hr over 60 Minutes Intravenous  Once 11/12/19 1301     11/12/19 1301  vancomycin (VANCOCIN) 1-5 GM/200ML-% IVPB       Note to Pharmacy: Sharyn Creamerooper, Judy   : cabinet override      11/12/19 1301 11/13/19 0114   11/12/19 0600  ceFAZolin (ANCEF) IVPB 2g/100 mL premix     Discontinue     2 g 200 mL/hr over 30 Minutes Intravenous On call to O.R. 11/11/19 2015 11/13/19 0559       Objective: Vitals: Today's Vitals   11/12/19 0540 11/12/19 1022 11/12/19 1024 11/12/19 1219  BP:  128/66  126/67  Pulse: 96 74  73  Resp:  20  16  Temp:  98.4 F (36.9 C)  98.1 F (36.7 C)  TempSrc:    Oral  SpO2: 98% 100%  98%  Weight:   58.5 kg   PainSc:        Intake/Output Summary (Last 24 hours) at 11/12/2019 1338 Last data filed at 11/12/2019 0600 Gross per 24 hour  Intake 1230.33 ml  Output 300 ml  Net 930.33 ml   Filed Weights   11/12/19 1024  Weight: 58.5  kg   Weight change:    Intake/Output from previous day: 06/30 0701 - 07/01 0700 In: 1230.3 [P.O.:240; I.V.:490.3; IV Piggyback:500] Out: 300 [Urine:300] Intake/Output this shift: No intake/output data recorded.  Examination:  General exam: AAO x1, elderly, frail,NAD, weak appearing. HEENT:Oral mucosa moist, Ear/Nose WNL grossly,dentition normal. Respiratory system: bilaterally clear,no wheezing or crackles,no use of accessory muscle, non tender. Cardiovascular system: S1 & S2 +, regular, No JVD. Gastrointestinal system: Abdomen soft, NT,ND, BS+. Nervous System:Alert, awake, moving extremities and grossly nonfocal Extremities: No edema, distal peripheral pulses palpable. Left hip it is tender Skin: No rashes,no icterus. MSK: Normal muscle bulk,tone, power  Data Reviewed: I have personally reviewed following labs and imaging studies CBC: Recent Labs  Lab 11/11/19 1727  WBC 17.1*  NEUTROABS 15.2*  HGB 9.7*  HCT 31.2*  MCV 96.6  PLT 342   Basic Metabolic Panel: Recent Labs  Lab 11/11/19 1727  NA 139  K 4.0  CL 100  CO2 26  GLUCOSE 182*  BUN 36*  CREATININE 1.24*  CALCIUM 9.0   GFR: Estimated Creatinine Clearance: 26.6 mL/min (A) (by C-G formula based on SCr of 1.24 mg/dL (H)). Liver Function Tests: No results for input(s): AST, ALT, ALKPHOS, BILITOT, PROT, ALBUMIN in the last 168 hours. No results for input(s): LIPASE, AMYLASE in the last 168 hours. No results for input(s): AMMONIA in the last 168 hours. Coagulation Profile: No results for input(s): INR, PROTIME in the last 168 hours. Cardiac Enzymes: Recent Labs  Lab 11/11/19 1727  CKTOTAL 38   BNP (last 3 results) No results for input(s): PROBNP in the last 8760 hours. HbA1C: No results for input(s): HGBA1C in the last 72 hours. CBG: No results for input(s): GLUCAP in the last 168 hours. Lipid Profile: No results for input(s): CHOL, HDL, LDLCALC, TRIG, CHOLHDL, LDLDIRECT in the last 72  hours. Thyroid Function Tests: No results for input(s): TSH, T4TOTAL, FREET4, T3FREE, THYROIDAB in the last 72 hours. Anemia Panel: No results for input(s): VITAMINB12, FOLATE, FERRITIN, TIBC, IRON, RETICCTPCT in the last 72 hours. Sepsis Labs: No results for input(s): PROCALCITON, LATICACIDVEN in the last 168 hours.  Recent Results (from the past 240 hour(s))  SARS Coronavirus 2 by RT PCR (hospital order, performed in [facility] hospital lab) Nasopharyngeal Nasopharyngeal Swab     Status: None   Collection Time: 11/11/19  5:27 PM   Specimen: Nasopharyngeal Swab  Result Value Ref Range Status   SARS Coronavirus 2 NEGATIVE NEGATIVE Final    Comment: (NOTE) SARS-CoV-2 target nucleic acids are NOT DETECTED.  The SARS-CoV-2 RNA is generally detectable in upper and lower respiratory specimens  during the acute phase of infection. The lowest concentration of SARS-CoV-2 viral copies this assay can detect is 250 copies / mL. A negative result does not preclude SARS-CoV-2 infection and should not be used as the sole basis for treatment or other patient management decisions.  A negative result may occur with improper specimen collection / handling, submission of specimen other than nasopharyngeal swab, presence of viral mutation(s) within the areas targeted by this assay, and inadequate number of viral copies (<250 copies / mL). A negative result must be combined with clinical observations, patient history, and epidemiological information.  Fact Sheet for Patients:   BoilerBrush.com.cy  Fact Sheet for Healthcare Providers: https://pope.com/  This test is not yet approved or  cleared by the [place] FDA and has been authorized for detection and/or diagnosis of SARS-CoV-2 by FDA under an Emergency Use Authorization (EUA).  This EUA will remain in effect (meaning this test can be used) for the duration of the COVID-19 declaration under  Section 564(b)(1) of the Act, 21 U.S.C. section 360bbb-3(b)(1), unless the authorization is terminated or revoked sooner.  Performed at [facility], [address], [place], [place] [address]   Surgical PCR screen     Status: Abnormal   Collection Time: 11/12/19  6:00 AM   Specimen: Nasal Mucosa; Nasal Swab  Result Value Ref Range Status   MRSA, PCR POSITIVE (A) NEGATIVE Final    Comment: RESULT CALLED TO, READ BACK BY AND VERIFIED WITH: TAYLOR,J @ 0942 ON [id number] BY POTEAT,S    Staphylococcus aureus POSITIVE (A) NEGATIVE Final    Comment: (NOTE) The Xpert SA Assay (FDA approved for NASAL specimens in patients 37 years of age and older), is one component of a comprehensive surveillance program. It is not intended to diagnose infection nor to guide or monitor treatment. Performed at [facility], [address], [place], [place] [address]       Radiology Studies: DG Chest 2 View  Result Date: 11/11/2019 CLINICAL DATA:  Unwitnessed fall. EXAM: CHEST - 2 VIEW COMPARISON:  May 27, 2019. FINDINGS: The heart size and mediastinal contours are within normal limits. Both lungs are clear. No pneumothorax or pleural effusion is noted. The visualized skeletal structures are unremarkable. IMPRESSION: No active cardiopulmonary disease. Aortic Atherosclerosis (ICD10-I70.0). Electronically Signed   By: Lupita Raider M.D.   On: 11/11/2019 17:08   DG Elbow Complete Left  Result Date: 11/11/2019 CLINICAL DATA:  Unwitnessed fall. EXAM: LEFT ELBOW - COMPLETE 3+ VIEW COMPARISON:  None. FINDINGS: There is no evidence of fracture, dislocation, or joint effusion. There is no evidence of arthropathy or other focal bone abnormality. Soft tissues are unremarkable. IMPRESSION: Negative. Electronically Signed   By: Lupita Raider M.D.   On: 11/11/2019 17:12   CT Head Wo Contrast  Result Date: 11/11/2019 CLINICAL DATA:  Unwitnessed fall EXAM: CT HEAD WITHOUT  CONTRAST TECHNIQUE: Contiguous axial images were obtained from the base of the skull through the vertex without intravenous contrast. COMPARISON:  None. FINDINGS: Brain: Low-density area noted in the right parietal lobe compatible with infarct, age indeterminate. There is atrophy and chronic small vessel disease changes. No hemorrhage or hydrocephalus. Vascular: No hyperdense vessel or unexpected calcification. Skull: No acute calvarial abnormality. Sinuses/Orbits: Visualized paranasal sinuses and mastoids clear. Orbital soft tissues unremarkable. 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 Atrophy, chronic small vessel disease. Electronically Signed   By: Charlett Nose M.D.   On: 11/11/2019 17:24   CT Cervical  Spine Wo Contrast  Result Date: 11/11/2019 CLINICAL DATA:  Neck pain EXAM: CT CERVICAL SPINE WITHOUT CONTRAST TECHNIQUE: Multidetector CT imaging of the cervical spine was performed without intravenous contrast. Multiplanar CT image reconstructions were also generated. COMPARISON:  None. FINDINGS: Alignment: Normal Skull base and vertebrae: Choose 1 Soft tissues and spinal canal: No prevertebral fluid or swelling. No visible canal hematoma. Disc levels: Degenerative disc disease with disc space narrowing and spurring at C6-7. Mild bilateral degenerative facet disease. Upper chest: No acute findings Other: None IMPRESSION: No acute bony abnormality. Electronically Signed   By: Charlett Nose M.D.   On: 11/11/2019 17:26   DG Femur Min 2 Views Left  Result Date: 11/11/2019 CLINICAL DATA:  Unwitnessed fall. EXAM: LEFT FEMUR 2 VIEWS COMPARISON:  None. FINDINGS: Moderately displaced fracture is seen involving the intertrochanteric region of the proximal left femur. IMPRESSION: Moderately displaced proximal left femoral intertrochanteric fracture. Electronically Signed   By: Lupita Raider M.D.   On: 11/11/2019 17:11   DG Hips Bilat W or Wo Pelvis 5 Views  Result Date:  11/11/2019 CLINICAL DATA:  Unwitnessed fall. EXAM: DG HIP (WITH OR WITHOUT PELVIS) 5+V BILAT COMPARISON:  None. FINDINGS: Moderately displaced fracture is seen involving the intertrochanteric region of the proximal left femur. Right hip is unremarkable. IMPRESSION: Moderately displaced proximal left femoral intertrochanteric fracture. Electronically Signed   By: Lupita Raider M.D.   On: 11/11/2019 17:10     LOS: 1 day   Lanae Boast, MD [facility]  11/12/2019, 1:38 PM

## 2019-11-13 ENCOUNTER — Encounter (HOSPITAL_COMMUNITY): Payer: Self-pay | Admitting: Orthopedic Surgery

## 2019-11-13 LAB — BASIC METABOLIC PANEL
Anion gap: 9 (ref 5–15)
BUN: 26 mg/dL — ABNORMAL HIGH (ref 8–23)
CO2: 25 mmol/L (ref 22–32)
Calcium: 8.5 mg/dL — ABNORMAL LOW (ref 8.9–10.3)
Chloride: 103 mmol/L (ref 98–111)
Creatinine, Ser: 1.02 mg/dL — ABNORMAL HIGH (ref 0.44–1.00)
GFR calc Af Amer: 56 mL/min — ABNORMAL LOW (ref >60)
GFR calc non Af Amer: 49 mL/min — ABNORMAL LOW (ref >60)
Glucose, Bld: 116 mg/dL — ABNORMAL HIGH (ref 70–99)
Potassium: 4.9 mmol/L (ref 3.5–5.1)
Sodium: 137 mmol/L (ref 135–145)

## 2019-11-13 LAB — CBC
HCT: 22.6 % — ABNORMAL LOW (ref 36.0–46.0)
Hemoglobin: 7.1 g/dL — ABNORMAL LOW (ref 12.0–15.0)
MCH: 30 pg (ref 26.0–34.0)
MCHC: 31.4 g/dL (ref 30.0–36.0)
MCV: 95.4 fL (ref 80.0–100.0)
Platelets: 277 10*3/uL (ref 150–400)
RBC: 2.37 MIL/uL — ABNORMAL LOW (ref 3.87–5.11)
RDW: 13.9 % (ref 11.5–15.5)
WBC: 7.7 10*3/uL (ref 4.0–10.5)
nRBC: 0 % (ref 0.0–0.2)

## 2019-11-13 LAB — HEMOGLOBIN AND HEMATOCRIT, BLOOD
HCT: 20.7 % — ABNORMAL LOW (ref 36.0–46.0)
Hemoglobin: 6.6 g/dL — CL (ref 12.0–15.0)

## 2019-11-13 LAB — PREPARE RBC (CROSSMATCH)

## 2019-11-13 MED ORDER — SODIUM CHLORIDE 0.9% IV SOLUTION: Freq: Once | INTRAVENOUS | Status: AC

## 2019-11-13 MED ORDER — ENOXAPARIN SODIUM 40 MG/0.4ML ~~LOC~~ SOLN
40.0000 mg | SUBCUTANEOUS | Status: DC
  Administered 2019-11-13 – 2019-11-16 (×4): 40 mg via SUBCUTANEOUS
  Filled 2019-11-13 (×4): qty 0.4

## 2019-11-13 MED ORDER — FUROSEMIDE 10 MG/ML IJ SOLN: 20.0000 mg | Freq: Once | INTRAMUSCULAR | Status: DC | PRN

## 2019-11-13 NOTE — Plan of Care (Signed)
  Problem: Elimination: Goal: Will not experience complications related to urinary retention Outcome: Progressing   Problem: Safety: Goal: Ability to remain free from injury will improve Outcome: Progressing   

## 2019-11-13 NOTE — Progress Notes (Signed)
Subjective: 1 Day Post-Op s/p Procedure(s): INTRAMEDULLARY (IM) NAIL FEMORAL   Patient unable to communicate needs, mumbling words this morning.  Objective:  PE: VITALS:   Vitals:   11/12/19 1900 11/12/19 2118 11/13/19 0125 11/13/19 0525  BP: (!) 123/54 118/72 (!) 118/93 115/65  Pulse: 81 92 92 90  Resp: 16 19 20 16   Temp:  98.9 F (37.2 C) 99 F (37.2 C) 99.1 F (37.3 C)  TempSrc:  Axillary    SpO2: 100% 97% 99% 100%  Weight:  58.5 kg    Height:  5\' 4"  (1.626 m)     Gen: Resting comfortably in bed, in no acute distress GI: Patient did not indicate tenderness on palpation of abdomen, abdomen soft MSK: + DP pulse, unable to assess distal sensation. Surgical dressings dry and intact with no drainage. Leg lengths appear equal.   LABS  Results for orders placed or performed during the hospital encounter of 11/11/19 (from the past 24 hour(s))  Type and screen [facility]     Status: None   Collection Time: 11/12/19 11:46 AM  Result Value Ref Range   ABO/RH(D) B NEG    Antibody Screen NEG    Sample Expiration      11/15/2019,2359 Performed at [facility], [address] [place], [place]   ABO/Rh     Status: None   Collection Time: 11/12/19 11:46 AM  Result Value Ref Range   ABO/RH(D)      B NEG Performed at [facility], [address], [place], [place]   Basic metabolic panel     Status: Abnormal   Collection Time: 11/13/19  2:55 AM  Result Value Ref Range   Sodium 137 135 - 145 mmol/L   Potassium 4.9 3.5 - 5.1 mmol/L   Chloride 103 98 - 111 mmol/L   CO2 25 22 - 32 mmol/L   Glucose, Bld 116 (H) 70 - 99 mg/dL   BUN 26 (H) 8 - 23 mg/dL   Creatinine, Ser 89381 (H) 0.44 - 1.00 mg/dL   Calcium 8.5 (L) 8.9 - 10.3 mg/dL   GFR calc non Af Amer 49 (L) >60 mL/min   GFR calc Af Amer 56 (L) >60 mL/min   Anion gap 9 5 - 15  CBC     Status: Abnormal   Collection Time: 11/13/19  2:55 AM    Result Value Ref Range   WBC 7.7 4.0 - 10.5 K/uL   RBC 2.37 (L) 3.87 - 5.11 MIL/uL   Hemoglobin 7.1 (L) 12.0 - 15.0 g/dL   HCT 0.17 (L) 36 - 46 %   MCV 95.4 80.0 - 100.0 fL   MCH 30.0 26.0 - 34.0 pg   MCHC 31.4 30.0 - 36.0 g/dL   RDW 01/14/20 51.0 - 25.8 %   Platelets 277 150 - 400 K/uL   nRBC 0.0 0.0 - 0.2 %    DG Chest 2 View  Result Date: 11/11/2019 CLINICAL DATA:  Unwitnessed fall. EXAM: CHEST - 2 VIEW COMPARISON:  May 27, 2019. FINDINGS: The heart size and mediastinal contours are within normal limits. Both lungs are clear. No pneumothorax or pleural effusion is noted. The visualized skeletal structures are unremarkable. IMPRESSION: No active cardiopulmonary disease. Aortic Atherosclerosis (ICD10-I70.0). Electronically Signed   By: 11/13/2019 M.D.   On: 11/11/2019 17:08   DG Elbow Complete Left  Result Date: 11/11/2019 CLINICAL DATA:  Unwitnessed fall. EXAM: LEFT ELBOW -  COMPLETE 3+ VIEW COMPARISON:  None. FINDINGS: There is no evidence of fracture, dislocation, or joint effusion. There is no evidence of arthropathy or other focal bone abnormality. Soft tissues are unremarkable. IMPRESSION: Negative. Electronically Signed   By: Lupita Raider M.D.   On: 11/11/2019 17:12   CT Head Wo Contrast  Result Date: 11/11/2019 CLINICAL DATA:  Unwitnessed fall EXAM: CT HEAD WITHOUT CONTRAST TECHNIQUE: Contiguous axial images were obtained from the base of the skull through the vertex without intravenous contrast. COMPARISON:  None. FINDINGS: Brain: Low-density area noted in the right parietal lobe compatible with infarct, age indeterminate. There is atrophy and chronic small vessel disease changes. No hemorrhage or hydrocephalus. Vascular: No hyperdense vessel or unexpected calcification. Skull: No acute calvarial abnormality. Sinuses/Orbits: Visualized paranasal sinuses and mastoids clear. Orbital soft tissues unremarkable. 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  Atrophy, chronic small vessel disease. Electronically Signed   By: Charlett Nose M.D.   On: 11/11/2019 17:24   CT Cervical Spine Wo Contrast  Result Date: 11/11/2019 CLINICAL DATA:  Neck pain EXAM: CT CERVICAL SPINE WITHOUT CONTRAST TECHNIQUE: Multidetector CT imaging of the cervical spine was performed without intravenous contrast. Multiplanar CT image reconstructions were also generated. COMPARISON:  None. FINDINGS: Alignment: Normal Skull base and vertebrae: Choose 1 Soft tissues and spinal canal: No prevertebral fluid or swelling. No visible canal hematoma. Disc levels: Degenerative disc disease with disc space narrowing and spurring at C6-7. Mild bilateral degenerative facet disease. Upper chest: No acute findings Other: None IMPRESSION: No acute bony abnormality. Electronically Signed   By: Charlett Nose M.D.   On: 11/11/2019 17:26   DG C-Arm 1-60 Min-No Report  Result Date: 11/12/2019 Fluoroscopy was utilized by the requesting physician.  No radiographic interpretation.   DG HIP OPERATIVE UNILAT W OR W/O PELVIS LEFT  Result Date: 11/12/2019 CLINICAL DATA:  Left IM nail EXAM: OPERATIVE LEFT HIP (WITH PELVIS IF PERFORMED) 3 VIEWS TECHNIQUE: Fluoroscopic spot image(s) were submitted for interpretation post-operatively. COMPARISON:  11/11/2019 FINDINGS: Spot images demonstrate placement of intramedullary nail and dynamic hip screw across the left femoral intertrochanteric fracture. Normal alignment. No hardware complicating feature. IMPRESSION: Internal fixation.  No visible complicating feature. Electronically Signed   By: Charlett Nose M.D.   On: 11/12/2019 16:14   DG Femur Min 2 Views Left  Result Date: 11/11/2019 CLINICAL DATA:  Unwitnessed fall. EXAM: LEFT FEMUR 2 VIEWS COMPARISON:  None. FINDINGS: Moderately displaced fracture is seen involving the intertrochanteric region of the proximal left femur. IMPRESSION: Moderately displaced proximal left femoral intertrochanteric fracture. Electronically  Signed   By: Lupita Raider M.D.   On: 11/11/2019 17:11   DG Hips Bilat W or Wo Pelvis 5 Views  Result Date: 11/11/2019 CLINICAL DATA:  Unwitnessed fall. EXAM: DG HIP (WITH OR WITHOUT PELVIS) 5+V BILAT COMPARISON:  None. FINDINGS: Moderately displaced fracture is seen involving the intertrochanteric region of the proximal left femur. Right hip is unremarkable. IMPRESSION: Moderately displaced proximal left femoral intertrochanteric fracture. Electronically Signed   By: Lupita Raider M.D.   On: 11/11/2019 17:10    Assessment/Plan: Active Problems:   Dementia without behavioral disturbance (HCC)   Left displaced femoral neck fracture (HCC)   Hypothyroidism   HTN (hypertension)   Left intertrochanteric femur fracture 1 Day Post-Op s/p Procedure(s):INTRAMEDULLARY (IM) NAIL FEMORAL  Weightbearing: WBAT LLE, patient did not ambulate at baseline but did propel herself in a wheelchair by shuffling her feet Insicional and dressing care: Reinforce dressings as  needed VTE prophylaxis: lovenox x 30 days Pain control: continue current regimen, tylenol for pain, limit tramadol use - only to be used if severe pain Follow - up plan: Follow up with Dr. Dion Saucier in  2 weeks  Dispo: pending PT eval, patient will likely d/c back to SNF   Contact information:   Weekdays 8-5 Janine Ores, PA-C [phone] A fter hours and holidays please check [email or web address] for group call information for [facility]  Armida Sans 11/13/2019, 7:59 AM

## 2019-11-13 NOTE — Care Management Important Message (Signed)
Important Message  Patient Details IM Letter given to Amada Jupiter SW Case Manager to present to the Patient Name: Tina Stein MRN: [id number] Date of Birth: 1928-03-14   Medicare Important Message Given:  Yes     Caren Macadam 11/13/2019, 12:25 PM

## 2019-11-13 NOTE — Progress Notes (Signed)
PT Cancellation Note  Patient Details Name: Marshelle Bilger MRN: [id number] DOB: [birth year]   Cancelled Treatment:     PT deferred this pm - pt scheduled for transfusion 2* Hgb 6.6.  Will follow   Aneesh Faller 11/13/2019, 4:23 PM

## 2019-11-13 NOTE — Progress Notes (Signed)
PT Cancellation Note  Patient Details Name: Tina Stein MRN: [id number] DOB: [birth year]   Cancelled Treatment:     PT order received and eval attempted but deferred 2* pt lethargy.  Will follow and re-attempt when pt is more alert.     Dinnis Rog 11/13/2019, 12:27 PM

## 2019-11-13 NOTE — Progress Notes (Signed)
CRITICAL VALUE ALERT  Critical Value:  HgB of 6.6  Date & Time Notied:  11/13/19 T 1425  Provider Notified: Dr Lanae Boast (via secure chat)  Orders Received/Actions taken: Awaiting further instructions.

## 2019-11-13 NOTE — Progress Notes (Addendum)
PROGRESS NOTE    Tina Stein  BJY:[id number] DOB: 01-Aug-1927 DOA: 11/11/2019 PCP: Laqueta Due., MD   Chef Complaints: Left hip pain  Brief Narrative: 86 year old female with history of advanced dementia requiring 24/7 care, hypothyroidism, hypertension, [facility] resident who was admitted with fall and left hip fracture. Patient is nonambulatory but did propel herself in a wheelchair by shuffling her feet who sustained a fall at the nursing home and sent to the ED. In the ED vitals stable neck and head CT negative, x-ray showed displaced left intertrochanteric fracture, orthopedic was consulted and patient was admitted. Patient underwent medial left femur 7/1   Subjective:  Alert awake demented, mumbling words.  Does not follow command.  On room air.  Assessment & Plan:  Left intertrochanteric hip fracture secondary to fall: Status post left IM nail 7/1.  Continue postop DVT prophylaxis PT OT pain control as per orthopedic.  Anticipating discharge back to skilled nursing facility once okay with orthopedic and PT OT   Anemia of acute blood loss postop, hemoglobin down at 7.1 g from 9.7 g.  Repeat H&H transfuse if less than 7 g.  Leukocytosis 17.1.  Resolved at 7.7.  Likely from fall/hip fracture  Advanced dementia: Patient does not follow any instruction today, mumbling words.  Continue supportive measures and delirium precaution.   Essential hypertension on diuretic alone, blood pressure is controlled. Monitor  Hypothyroidism continue levothyroxine.  Addendum at 2:30 PM Repeat h/h is low at 6.6-I called patient's daughter and discussed risk and benefits and alternatives agreed for transfusion and I ordered 2 units PRBC also ordered Lasix 20 mg as needed for any kind of fluid overload or crackles.  DVT prophylaxis: enoxaparin (LOVENOX) injection 40 mg Start: 11/13/19 0800 SCDs Start: 11/12/19 1709 Code Status: DNR Family Communication: discussed with care  team  Status is: Inpatient  Remains inpatient appropriate because:Ongoing active pain requiring inpatient pain management and For operative intervention of the left hip fracture  Dispo: The patient is from: SNF              Anticipated d/c is to: SNF              Anticipated d/c date is: 1 day              Patient currently is not medically stable to d/c.  Anticipate discharge back to skilled nursing facility after PT OT evaluation and cleared by orthopedics   Nutrition: Diet Order            Diet regular Room service appropriate? Yes; Fluid consistency: Thin  Diet effective now                 Nutrition Problem: Inadequate oral intake Etiology: inability to eat Signs/Symptoms: NPO status Interventions: Refer to RD note for recommendations Body mass index is 22.14 kg/m.  Consultants:see note  Procedures:see note Microbiology:see note  Medications: Scheduled Meds: . acetaminophen  500 mg Oral Q6H  . cholecalciferol  2,000 Units Oral Daily  . docusate sodium  100 mg Oral BID  . enoxaparin (LOVENOX) injection  40 mg Subcutaneous Q24H  . ferrous sulfate  325 mg Oral TID PC  . furosemide  20 mg Oral Daily  . gabapentin  100 mg Oral QHS  . levothyroxine  50 mcg Oral Q0600  . mouth rinse  15 mL Mouth Rinse BID  . mupirocin ointment  1 application Nasal BID  . senna  1 tablet Oral BID  . timolol  1 drop Both Eyes Daily   Continuous Infusions: . 0.45 % NaCl with KCl 20 mEq / L Stopped (11/13/19 0715)    Antimicrobials: Anti-infectives (From admission, onward)   Start     Dose/Rate Route Frequency Ordered Stop   11/12/19 2000  ceFAZolin (ANCEF) IVPB 2g/100 mL premix        2 g 200 mL/hr over 30 Minutes Intravenous Every 6 hours 11/12/19 1708 11/13/19 0222   11/12/19 1315  vancomycin (VANCOCIN) IVPB 1000 mg/200 mL premix        1,000 mg 200 mL/hr over 60 Minutes Intravenous  Once 11/12/19 1301 11/12/19 1408   11/12/19 1301  vancomycin (VANCOCIN) 1-5 GM/200ML-% IVPB        Note to Pharmacy: Sharyn Creamer   : cabinet override      11/12/19 1301 11/12/19 1349   11/12/19 0600  ceFAZolin (ANCEF) IVPB 2g/100 mL premix        2 g 200 mL/hr over 30 Minutes Intravenous On call to O.R. 11/11/19 2015 11/12/19 1350       Objective: Vitals: Today's Vitals   11/13/19 0319 11/13/19 0437 11/13/19 0525 11/13/19 0938  BP:   115/65 (!) 105/44  Pulse:   90 86  Resp:   16 16  Temp:   99.1 F (37.3 C)   TempSrc:      SpO2:   100% 92%  Weight:      Height:      PainSc: Asleep Asleep      Intake/Output Summary (Last 24 hours) at 11/13/2019 1222 Last data filed at 11/13/2019 1000 Gross per 24 hour  Intake 1721.25 ml  Output 750 ml  Net 971.25 ml   Filed Weights   11/12/19 1024 11/12/19 2118  Weight: 58.5 kg 58.5 kg   Weight change:    Intake/Output from previous day: 07/01 0701 - 07/02 0700 In: 1601.3 [I.V.:1401.3; IV Piggyback:200] Out: 750 [Urine:700; Blood:50] Intake/Output this shift: Total I/O In: 120 [P.O.:120] Out: -   Examination:  General exam: Mumbling words, demented.  Not in acute distress  HEENT:Oral mucosa moist, Ear/Nose WNL grossly, dentition normal. Respiratory system: bilaterally clear,no wheezing or crackles,no use of accessory muscle Cardiovascular system: S1 & S2 +, No JVD,. Gastrointestinal system: Abdomen soft, NT,ND, BS+ Nervous System: Mumbling words demented, not following instruction.   Extremities: No edema, distal peripheral pulses palpable.  Surgical site with dressing intact. Skin: No rashes,no icterus. MSK: Normal muscle bulk,tone, power  Data Reviewed: I have personally reviewed following labs and imaging studies CBC: Recent Labs  Lab 11/11/19 1727 11/13/19 0255  WBC 17.1* 7.7  NEUTROABS 15.2*  --   HGB 9.7* 7.1*  HCT 31.2* 22.6*  MCV 96.6 95.4  PLT 342 277   Basic Metabolic Panel: Recent Labs  Lab 11/11/19 1727 11/13/19 0255  NA 139 137  K 4.0 4.9  CL 100 103  CO2 26 25  GLUCOSE 182* 116*  BUN  36* 26*  CREATININE 1.24* 1.02*  CALCIUM 9.0 8.5*   GFR: Estimated Creatinine Clearance: 32.3 mL/min (A) (by C-G formula based on SCr of 1.02 mg/dL (H)). Liver Function Tests: No results for input(s): AST, ALT, ALKPHOS, BILITOT, PROT, ALBUMIN in the last 168 hours. No results for input(s): LIPASE, AMYLASE in the last 168 hours. No results for input(s): AMMONIA in the last 168 hours. Coagulation Profile: No results for input(s): INR, PROTIME in the last 168 hours. Cardiac Enzymes: Recent Labs  Lab 11/11/19 1727  CKTOTAL 38   BNP (last 3  results) No results for input(s): PROBNP in the last 8760 hours. HbA1C: No results for input(s): HGBA1C in the last 72 hours. CBG: No results for input(s): GLUCAP in the last 168 hours. Lipid Profile: No results for input(s): CHOL, HDL, LDLCALC, TRIG, CHOLHDL, LDLDIRECT in the last 72 hours. Thyroid Function Tests: No results for input(s): TSH, T4TOTAL, FREET4, T3FREE, THYROIDAB in the last 72 hours. Anemia Panel: No results for input(s): VITAMINB12, FOLATE, FERRITIN, TIBC, IRON, RETICCTPCT in the last 72 hours. Sepsis Labs: No results for input(s): PROCALCITON, LATICACIDVEN in the last 168 hours.  Recent Results (from the past 240 hour(s))  SARS Coronavirus 2 by RT PCR (hospital order, performed in [facility] hospital lab) Nasopharyngeal Nasopharyngeal Swab     Status: None   Collection Time: 11/11/19  5:27 PM   Specimen: Nasopharyngeal Swab  Result Value Ref Range Status   SARS Coronavirus 2 NEGATIVE NEGATIVE Final    Comment: (NOTE) SARS-CoV-2 target nucleic acids are NOT DETECTED.  The SARS-CoV-2 RNA is generally detectable in upper and lower respiratory specimens during the acute phase of infection. The lowest concentration of SARS-CoV-2 viral copies this assay can detect is 250 copies / mL. A negative result does not preclude SARS-CoV-2 infection and should not be used as the sole basis for treatment or other patient management  decisions.  A negative result may occur with improper specimen collection / handling, submission of specimen other than nasopharyngeal swab, presence of viral mutation(s) within the areas targeted by this assay, and inadequate number of viral copies (<250 copies / mL). A negative result must be combined with clinical observations, patient history, and epidemiological information.  Fact Sheet for Patients:   BoilerBrush.com.cyhttps://www.fda.gov/media/136312/download  Fact Sheet for Healthcare Providers: https://pope.com/https://www.fda.gov/media/136313/download  This test is not yet approved or  cleared by the [place] FDA and has been authorized for detection and/or diagnosis of SARS-CoV-2 by FDA under an Emergency Use Authorization (EUA).  This EUA will remain in effect (meaning this test can be used) for the duration of the COVID-19 declaration under Section 564(b)(1) of the Act, 21 U.S.C. section 360bbb-3(b)(1), unless the authorization is terminated or revoked sooner.  Performed at [facility], [address] [place], [place] [address]   Surgical PCR screen     Status: Abnormal   Collection Time: 11/12/19  6:00 AM   Specimen: Nasal Mucosa; Nasal Swab  Result Value Ref Range Status   MRSA, PCR POSITIVE (A) NEGATIVE Final    Comment: RESULT CALLED TO, READ BACK BY AND VERIFIED WITH: TAYLOR,J @ 0942 ON [id number] BY POTEAT,S    Staphylococcus aureus POSITIVE (A) NEGATIVE Final    Comment: (NOTE) The Xpert SA Assay (FDA approved for NASAL specimens in patients 84 years of age and older), is one component of a comprehensive surveillance program. It is not intended to diagnose infection nor to guide or monitor treatment. Performed at [facility], [address] [place], [place] [address]       Radiology Studies: DG Chest 2 View  Result Date: 11/11/2019 CLINICAL DATA:  Unwitnessed fall. EXAM: CHEST - 2 VIEW COMPARISON:  May 27, 2019. FINDINGS: The heart size  and mediastinal contours are within normal limits. Both lungs are clear. No pneumothorax or pleural effusion is noted. The visualized skeletal structures are unremarkable. IMPRESSION: No active cardiopulmonary disease. Aortic Atherosclerosis (ICD10-I70.0). Electronically Signed   By: Lupita RaiderJames  Green Jr M.D.   On: 11/11/2019 17:08   DG Elbow Complete Left  Result Date: 11/11/2019  CLINICAL DATA:  Unwitnessed fall. EXAM: LEFT ELBOW - COMPLETE 3+ VIEW COMPARISON:  None. FINDINGS: There is no evidence of fracture, dislocation, or joint effusion. There is no evidence of arthropathy or other focal bone abnormality. Soft tissues are unremarkable. IMPRESSION: Negative. Electronically Signed   By: Lupita Raider M.D.   On: 11/11/2019 17:12   CT Head Wo Contrast  Result Date: 11/11/2019 CLINICAL DATA:  Unwitnessed fall EXAM: CT HEAD WITHOUT CONTRAST TECHNIQUE: Contiguous axial images were obtained from the base of the skull through the vertex without intravenous contrast. COMPARISON:  None. FINDINGS: Brain: Low-density area noted in the right parietal lobe compatible with infarct, age indeterminate. There is atrophy and chronic small vessel disease changes. No hemorrhage or hydrocephalus. Vascular: No hyperdense vessel or unexpected calcification. Skull: No acute calvarial abnormality. Sinuses/Orbits: Visualized paranasal sinuses and mastoids clear. Orbital soft tissues unremarkable. 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 Atrophy, chronic small vessel disease. Electronically Signed   By: Charlett Nose M.D.   On: 11/11/2019 17:24   CT Cervical Spine Wo Contrast  Result Date: 11/11/2019 CLINICAL DATA:  Neck pain EXAM: CT CERVICAL SPINE WITHOUT CONTRAST TECHNIQUE: Multidetector CT imaging of the cervical spine was performed without intravenous contrast. Multiplanar CT image reconstructions were also generated. COMPARISON:  None. FINDINGS: Alignment: Normal Skull base and vertebrae: Choose 1  Soft tissues and spinal canal: No prevertebral fluid or swelling. No visible canal hematoma. Disc levels: Degenerative disc disease with disc space narrowing and spurring at C6-7. Mild bilateral degenerative facet disease. Upper chest: No acute findings Other: None IMPRESSION: No acute bony abnormality. Electronically Signed   By: Charlett Nose M.D.   On: 11/11/2019 17:26   DG C-Arm 1-60 Min-No Report  Result Date: 11/12/2019 Fluoroscopy was utilized by the requesting physician.  No radiographic interpretation.   DG HIP OPERATIVE UNILAT W OR W/O PELVIS LEFT  Result Date: 11/12/2019 CLINICAL DATA:  Left IM nail EXAM: OPERATIVE LEFT HIP (WITH PELVIS IF PERFORMED) 3 VIEWS TECHNIQUE: Fluoroscopic spot image(s) were submitted for interpretation post-operatively. COMPARISON:  11/11/2019 FINDINGS: Spot images demonstrate placement of intramedullary nail and dynamic hip screw across the left femoral intertrochanteric fracture. Normal alignment. No hardware complicating feature. IMPRESSION: Internal fixation.  No visible complicating feature. Electronically Signed   By: Charlett Nose M.D.   On: 11/12/2019 16:14   DG Femur Min 2 Views Left  Result Date: 11/11/2019 CLINICAL DATA:  Unwitnessed fall. EXAM: LEFT FEMUR 2 VIEWS COMPARISON:  None. FINDINGS: Moderately displaced fracture is seen involving the intertrochanteric region of the proximal left femur. IMPRESSION: Moderately displaced proximal left femoral intertrochanteric fracture. Electronically Signed   By: Lupita Raider M.D.   On: 11/11/2019 17:11   DG Hips Bilat W or Wo Pelvis 5 Views  Result Date: 11/11/2019 CLINICAL DATA:  Unwitnessed fall. EXAM: DG HIP (WITH OR WITHOUT PELVIS) 5+V BILAT COMPARISON:  None. FINDINGS: Moderately displaced fracture is seen involving the intertrochanteric region of the proximal left femur. Right hip is unremarkable. IMPRESSION: Moderately displaced proximal left femoral intertrochanteric fracture. Electronically Signed    By: Lupita Raider M.D.   On: 11/11/2019 17:10     LOS: 2 days   Lanae Boast, MD [facility]  11/13/2019, 12:22 PM

## 2019-11-14 LAB — CBC
HCT: 28.9 % — ABNORMAL LOW (ref 36.0–46.0)
Hemoglobin: 9.8 g/dL — ABNORMAL LOW (ref 12.0–15.0)
MCH: 30.4 pg (ref 26.0–34.0)
MCHC: 33.9 g/dL (ref 30.0–36.0)
MCV: 89.8 fL (ref 80.0–100.0)
Platelets: 235 10*3/uL (ref 150–400)
RBC: 3.22 MIL/uL — ABNORMAL LOW (ref 3.87–5.11)
RDW: 14.6 % (ref 11.5–15.5)
WBC: 9.1 10*3/uL (ref 4.0–10.5)
nRBC: 0 % (ref 0.0–0.2)

## 2019-11-14 LAB — BASIC METABOLIC PANEL
Anion gap: 8 (ref 5–15)
BUN: 35 mg/dL — ABNORMAL HIGH (ref 8–23)
CO2: 27 mmol/L (ref 22–32)
Calcium: 8.1 mg/dL — ABNORMAL LOW (ref 8.9–10.3)
Chloride: 99 mmol/L (ref 98–111)
Creatinine, Ser: 1.03 mg/dL — ABNORMAL HIGH (ref 0.44–1.00)
GFR calc Af Amer: 56 mL/min — ABNORMAL LOW (ref >60)
GFR calc non Af Amer: 48 mL/min — ABNORMAL LOW (ref >60)
Glucose, Bld: 103 mg/dL — ABNORMAL HIGH (ref 70–99)
Potassium: 4.2 mmol/L (ref 3.5–5.1)
Sodium: 134 mmol/L — ABNORMAL LOW (ref 135–145)

## 2019-11-14 LAB — SARS CORONAVIRUS 2 (TAT 6-24 HRS): SARS Coronavirus 2: NEGATIVE

## 2019-11-14 MED ORDER — LIP MEDEX EX OINT
TOPICAL_OINTMENT | CUTANEOUS | Status: AC
  Filled 2019-11-14: qty 7

## 2019-11-14 NOTE — TOC Progression Note (Signed)
Transition of Care [facility]) - Progression Note    Patient Details  Name: Tina Stein MRN: [id number] Date of Birth: 1927/05/25  [facility] CM/SW Contact  Amada Jupiter, LCSW Phone Number: 11/14/2019, 2:13 PM  Clinical Narrative:   Late note - spoke with pt's daughter end of the week and have confirmed plan for her to return to [facility] when medically ready.  (She has been a long term resident there since Jan 2021).  Alerted by RN that MD feels she is medically ready today for return.  Attempting to reach admissions/ facility at this time.  Will keep staff posted.    Expected Discharge Plan: Skilled Nursing Facility Barriers to Discharge: Continued Medical Work up  Expected Discharge Plan and Services Expected Discharge Plan: Skilled Nursing Facility In-house Referral: Clinical Social Work Discharge Planning Services: NA   Living arrangements for the past 2 months: Skilled Nursing Facility                 DME Arranged: N/A DME Agency: NA       HH Arranged: NA HH Agency: NA         Social Determinants of Health (SDOH) Interventions    Readmission Risk Interventions No flowsheet data found.

## 2019-11-14 NOTE — TOC Progression Note (Signed)
Transition of Care [facility]) - Progression Note    Patient Details  Name: Tina Stein MRN: [id number] Date of Birth: 09-02-1927  Transition of Care [facility]) CM/SW Contact  Amada Jupiter, LCSW Phone Number: 11/14/2019, 3:33 PM  Clinical Narrative:  Have made several attempts to reach admissions coordinator or someone at the facility [facility]) main number today to try and coordinate pt's return.  Will continue to try and will alert oncoming TOC for tomorrow that we need to work on her return.     Expected Discharge Plan: Skilled Nursing Facility Barriers to Discharge: Continued Medical Work up  Expected Discharge Plan and Services Expected Discharge Plan: Skilled Nursing Facility In-house Referral: Clinical Social Work Discharge Planning Services: NA   Living arrangements for the past 2 months: Skilled Nursing Facility                 DME Arranged: N/A DME Agency: NA       HH Arranged: NA HH Agency: NA         Social Determinants of Health (SDOH) Interventions    Readmission Risk Interventions No flowsheet data found.

## 2019-11-14 NOTE — Discharge Summary (Addendum)
Physician Discharge Summary  Tina Stein NFA:[id number] DOB: February 01, 1928 DOA: 11/11/2019  PCP: Laqueta Due., MD  Admit date: 11/11/2019 Discharge date: 11/16/2019  Admitted From: SNF Disposition:  SNF  Recommendations for Outpatient Follow-up:  1. Follow up with PCP in 1-2 weeks 2. Please obtain BMP/CBC in one week 3. Please follow up on the following pending results:  Home Health: No  Equipment/Devices: None  Discharge Condition: Stable Code Status: DNR Diet recommendation:  Diet Order            Diet - low sodium heart healthy           Diet regular Room service appropriate? Yes; Fluid consistency: Thin  Diet effective now                  Brief/Interim Summary:  86 year old female with history of advanced dementia requiring 24/7 care, hypothyroidism, hypertension, metabolic [facility] nursing home resident who was admitted with fall and left hip fracture. Patient is nonambulatory but did propel herself in a wheelchair by shuffling her feet who sustained a fall at the nursing home and sent to the ED. In the ED vitals stable neck and head CT negative, x-ray showed displaced left intertrochanteric fracture, orthopedic was consulted and patient was admitted. Patient underwent medial left femur 7/1  Patient had acute blood loss anemia postop needing blood transfusion and hemoglobin has stabilized Pt restgni well, n onew changes, cont post op recovery at SNF AND CONT LOVENOX AND FU WITH ORTHOPEDICS I N2 WK  Discharge Diagnoses:  Active Problems:   Dementia without behavioral disturbance (HCC)   Left displaced femoral neck fracture (HCC)   Hypothyroidism   HTN (hypertension)  Left intertrochanteric hip fracture secondary to fall: S/p left IM nail 7/1.  Doing well postop.  Continue DVT prophylaxis Lovenox x30 days per orthopedic.  Continue dressing change and follow-up in the office  Anemia of acute blood loss postop, globin down trended and 6.7 g, status post 2 unit PRBC and  hemoglobin is stable hemoglobin is improved. Recent Labs  Lab 11/11/19 1727 11/13/19 0255 11/13/19 1346 11/14/19 0310  HGB 9.7* 7.1* 6.6* 9.8*  HCT 31.2* 22.6* 20.7* 28.9*   Leukocytosis 17.1.  Resolved likely reactive from fracture.  Advanced dementia: Patient is alert awake oriented x1-0, mumbling words.  Continue supportive care fall precaution. Essential hypertension: Blood pressure controlled.  Continue current meds  Hypothyroidism continue levothyroxine. Consults:  orthopedics  Subjective: Pain is stable mumbling words.  Demented. Discharge Exam: Vitals:   11/15/19 2144 11/16/19 0510  BP: 104/60 (!) 118/39  Pulse: 80 62  Resp: 18 16  Temp: 98.6 F (37 C) 98.6 F (37 C)  SpO2: 96% 99%   General exam: AAO X0-1, NAD, weak appearing. HEENT:Oral mucosa moist, Ear/Nose WNL grossly, dentition normal. Respiratory system: bilaterally CLEAR,no wheezing or crackles,no use of accessory muscle Cardiovascular system: S1 & S2 +, No JVD,. Gastrointestinal system: Abdomen soft, NT,ND, BS+ Nervous System:Alert, awake, moving extremities and grossly nonfocal Extremities: No edema, distal peripheral pulses palpable.  SURGICAL SITE CLEAN AND DRESSING INTACT. Skin: No rashes,no icterus. MSK: Normal muscle bulk,tone, power Discharge Instructions  Discharge Instructions    Diet - low sodium heart healthy   Complete by: As directed    Discharge wound care:   Complete by: As directed    Follow-up for wound care and dressing removal with orthopedic surgery   Increase activity slowly   Complete by: As directed      Allergies as of 11/16/2019  No Known Allergies     Medication List    TAKE these medications   enoxaparin 40 MG/0.4ML injection Commonly known as: LOVENOX Inject 0.4 mLs (40 mg total) into the skin daily. For DVT prophylaxis after surgery   ferrous sulfate 325 (65 FE) MG tablet Take 1 tablet (325 mg total) by mouth 3 (three) times daily after meals.    furosemide 20 MG tablet Commonly known as: LASIX Take 20 mg by mouth daily.   gabapentin 100 MG capsule Commonly known as: NEURONTIN Take 100 mg by mouth at bedtime.   levothyroxine 50 MCG tablet Commonly known as: SYNTHROID Take 50 mcg by mouth daily before breakfast.   ondansetron 4 MG tablet Commonly known as: ZOFRAN Take 4 mg by mouth every 8 (eight) hours as needed for vomiting.   timolol 0.5 % ophthalmic solution Commonly known as: TIMOPTIC Place 1 drop into both eyes daily.   Vitamin D 50 MCG (2000 UT) Caps Take 2,000 Units by mouth daily.            Discharge Care Instructions  (From admission, onward)         Start     Ordered   11/15/19 0000  Discharge wound care:       Comments: Follow-up for wound care and dressing removal with orthopedic surgery   11/15/19 1042          Follow-up Information    Teryl Lucy, MD. Schedule an appointment as soon as possible for a visit in 2 weeks.   Specialty: Orthopedic Surgery Contact information: [address] [place] [address] [phone]        Laqueta Due., MD Follow up in 1 week(s).   Specialty: Internal Medicine Contact information: [phone] [place] [address] [phone]              No Known Allergies  The results of significant diagnostics from this hospitalization (including imaging, microbiology, ancillary and laboratory) are listed below for reference.    Microbiology: Recent Results (from the past 240 hour(s))  SARS Coronavirus 2 by RT PCR (hospital order, performed in [facility] hospital lab) Nasopharyngeal Nasopharyngeal Swab     Status: None   Collection Time: 11/11/19  5:27 PM   Specimen: Nasopharyngeal Swab  Result Value Ref Range Status   SARS Coronavirus 2 NEGATIVE NEGATIVE Final    Comment: (NOTE) SARS-CoV-2 target nucleic acids are NOT DETECTED.  The SARS-CoV-2 RNA is generally detectable in upper and lower respiratory  specimens during the acute phase of infection. The lowest concentration of SARS-CoV-2 viral copies this assay can detect is 250 copies / mL. A negative result does not preclude SARS-CoV-2 infection and should not be used as the sole basis for treatment or other patient management decisions.  A negative result may occur with improper specimen collection / handling, submission of specimen other than nasopharyngeal swab, presence of viral mutation(s) within the areas targeted by this assay, and inadequate number of viral copies (<250 copies / mL). A negative result must be combined with clinical observations, patient history, and epidemiological information.  Fact Sheet for Patients:   BoilerBrush.com.cy  Fact Sheet for Healthcare Providers: https://pope.com/  This test is not yet approved or  cleared by the [place] FDA and has been authorized for detection and/or diagnosis of SARS-CoV-2 by FDA under an Emergency Use Authorization (EUA).  This EUA will remain in effect (meaning this test can be used) for the duration  of the COVID-19 declaration under Section 564(b)(1) of the Act, 21 U.S.C. section 360bbb-3(b)(1), unless the authorization is terminated or revoked sooner.  Performed at [facility], [address], [place], [place] [address]   Surgical PCR screen     Status: Abnormal   Collection Time: 11/12/19  6:00 AM   Specimen: Nasal Mucosa; Nasal Swab  Result Value Ref Range Status   MRSA, PCR POSITIVE (A) NEGATIVE Final    Comment: RESULT CALLED TO, READ BACK BY AND VERIFIED WITH: TAYLOR,J @ 0942 ON [id number] BY POTEAT,S    Staphylococcus aureus POSITIVE (A) NEGATIVE Final    Comment: (NOTE) The Xpert SA Assay (FDA approved for NASAL specimens in patients 59 years of age and older), is one component of a comprehensive surveillance program. It is not intended to diagnose infection nor to guide or monitor  treatment. Performed at [facility], [address] [place], [place] [address]   SARS CORONAVIRUS 2 (TAT 6-24 HRS) Nasopharyngeal Nasopharyngeal Swab     Status: None   Collection Time: 11/14/19  9:10 AM   Specimen: Nasopharyngeal Swab  Result Value Ref Range Status   SARS Coronavirus 2 NEGATIVE NEGATIVE Final    Comment: (NOTE) SARS-CoV-2 target nucleic acids are NOT DETECTED.  The SARS-CoV-2 RNA is generally detectable in upper and lower respiratory specimens during the acute phase of infection. Negative results do not preclude SARS-CoV-2 infection, do not rule out co-infections with other pathogens, and should not be used as the sole basis for treatment or other patient management decisions. Negative results must be combined with clinical observations, patient history, and epidemiological information. The expected result is Negative.  Fact Sheet for Patients: HairSlick.no  Fact Sheet for Healthcare Providers: quierodirigir.com  This test is not yet approved or cleared by the [place] FDA and  has been authorized for detection and/or diagnosis of SARS-CoV-2 by FDA under an Emergency Use Authorization (EUA). This EUA will remain  in effect (meaning this test can be used) for the duration of the COVID-19 declaration under Se ction 564(b)(1) of the Act, 21 U.S.C. section 360bbb-3(b)(1), unless the authorization is terminated or revoked sooner.  Performed at [facility], [address], [place], [place] [address]     Procedures/Studies: DG Chest 2 View  Result Date: 11/11/2019 CLINICAL DATA:  Unwitnessed fall. EXAM: CHEST - 2 VIEW COMPARISON:  May 27, 2019. FINDINGS: The heart size and mediastinal contours are within normal limits. Both lungs are clear. No pneumothorax or pleural effusion is noted. The visualized skeletal structures are unremarkable. IMPRESSION: No active cardiopulmonary  disease. Aortic Atherosclerosis (ICD10-I70.0). Electronically Signed   By: Lupita Raider M.D.   On: 11/11/2019 17:08   DG Elbow Complete Left  Result Date: 11/11/2019 CLINICAL DATA:  Unwitnessed fall. EXAM: LEFT ELBOW - COMPLETE 3+ VIEW COMPARISON:  None. FINDINGS: There is no evidence of fracture, dislocation, or joint effusion. There is no evidence of arthropathy or other focal bone abnormality. Soft tissues are unremarkable. IMPRESSION: Negative. Electronically Signed   By: Lupita Raider M.D.   On: 11/11/2019 17:12   CT Head Wo Contrast  Result Date: 11/11/2019 CLINICAL DATA:  Unwitnessed fall EXAM: CT HEAD WITHOUT CONTRAST TECHNIQUE: Contiguous axial images were obtained from the base of the skull through the vertex without intravenous contrast. COMPARISON:  None. FINDINGS: Brain: Low-density area noted in the right parietal lobe compatible with infarct, age indeterminate. There is atrophy and chronic small vessel disease changes. No hemorrhage  or hydrocephalus. Vascular: No hyperdense vessel or unexpected calcification. Skull: No acute calvarial abnormality. Sinuses/Orbits: Visualized paranasal sinuses and mastoids clear. Orbital soft tissues unremarkable. 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 Atrophy, chronic small vessel disease. Electronically Signed   By: Charlett NoseKevin  Dover M.D.   On: 11/11/2019 17:24   CT Cervical Spine Wo Contrast  Result Date: 11/11/2019 CLINICAL DATA:  Neck pain EXAM: CT CERVICAL SPINE WITHOUT CONTRAST TECHNIQUE: Multidetector CT imaging of the cervical spine was performed without intravenous contrast. Multiplanar CT image reconstructions were also generated. COMPARISON:  None. FINDINGS: Alignment: Normal Skull base and vertebrae: Choose 1 Soft tissues and spinal canal: No prevertebral fluid or swelling. No visible canal hematoma. Disc levels: Degenerative disc disease with disc space narrowing and spurring at C6-7. Mild bilateral degenerative facet  disease. Upper chest: No acute findings Other: None IMPRESSION: No acute bony abnormality. Electronically Signed   By: Charlett NoseKevin  Dover M.D.   On: 11/11/2019 17:26   DG C-Arm 1-60 Min-No Report  Result Date: 11/12/2019 Fluoroscopy was utilized by the requesting physician.  No radiographic interpretation.   DG HIP OPERATIVE UNILAT W OR W/O PELVIS LEFT  Result Date: 11/12/2019 CLINICAL DATA:  Left IM nail EXAM: OPERATIVE LEFT HIP (WITH PELVIS IF PERFORMED) 3 VIEWS TECHNIQUE: Fluoroscopic spot image(s) were submitted for interpretation post-operatively. COMPARISON:  11/11/2019 FINDINGS: Spot images demonstrate placement of intramedullary nail and dynamic hip screw across the left femoral intertrochanteric fracture. Normal alignment. No hardware complicating feature. IMPRESSION: Internal fixation.  No visible complicating feature. Electronically Signed   By: Charlett NoseKevin  Dover M.D.   On: 11/12/2019 16:14   DG Femur Min 2 Views Left  Result Date: 11/11/2019 CLINICAL DATA:  Unwitnessed fall. EXAM: LEFT FEMUR 2 VIEWS COMPARISON:  None. FINDINGS: Moderately displaced fracture is seen involving the intertrochanteric region of the proximal left femur. IMPRESSION: Moderately displaced proximal left femoral intertrochanteric fracture. Electronically Signed   By: Lupita RaiderJames  Green Jr M.D.   On: 11/11/2019 17:11   DG Hips Bilat W or Wo Pelvis 5 Views  Result Date: 11/11/2019 CLINICAL DATA:  Unwitnessed fall. EXAM: DG HIP (WITH OR WITHOUT PELVIS) 5+V BILAT COMPARISON:  None. FINDINGS: Moderately displaced fracture is seen involving the intertrochanteric region of the proximal left femur. Right hip is unremarkable. IMPRESSION: Moderately displaced proximal left femoral intertrochanteric fracture. Electronically Signed   By: Lupita RaiderJames  Green Jr M.D.   On: 11/11/2019 17:10    Labs: BNP (last 3 results) Recent Labs    05/27/19 1321  BNP 517.0*   Basic Metabolic Panel: Recent Labs  Lab 11/11/19 1727 11/13/19 0255  11/14/19 0310 11/15/19 0345  NA 139 137 134* 137  K 4.0 4.9 4.2 4.0  CL 100 103 99 101  CO2 26 25 27 27   GLUCOSE 182* 116* 103* 94  BUN 36* 26* 35* 26*  CREATININE 1.24* 1.02* 1.03* 0.85  CALCIUM 9.0 8.5* 8.1* 8.4*   Liver Function Tests: No results for input(s): AST, ALT, ALKPHOS, BILITOT, PROT, ALBUMIN in the last 168 hours. No results for input(s): LIPASE, AMYLASE in the last 168 hours. No results for input(s): AMMONIA in the last 168 hours. CBC: Recent Labs  Lab 11/11/19 1727 11/13/19 0255 11/13/19 1346 11/14/19 0310 11/15/19 0345  WBC 17.1* 7.7  --  9.1 7.6  NEUTROABS 15.2*  --   --   --   --   HGB 9.7* 7.1* 6.6* 9.8* 9.8*  HCT 31.2* 22.6* 20.7* 28.9* 30.4*  MCV 96.6 95.4  --  89.8 92.4  PLT 342 277  --  235 241   Cardiac Enzymes: Recent Labs  Lab 11/11/19 1727  CKTOTAL 38   BNP: Invalid input(s): POCBNP CBG: No results for input(s): GLUCAP in the last 168 hours. D-Dimer No results for input(s): DDIMER in the last 72 hours. Hgb A1c No results for input(s): HGBA1C in the last 72 hours. Lipid Profile No results for input(s): CHOL, HDL, LDLCALC, TRIG, CHOLHDL, LDLDIRECT in the last 72 hours. Thyroid function studies No results for input(s): TSH, T4TOTAL, T3FREE, THYROIDAB in the last 72 hours.  Invalid input(s): FREET3 Anemia work up No results for input(s): VITAMINB12, FOLATE, FERRITIN, TIBC, IRON, RETICCTPCT in the last 72 hours. Urinalysis    Component Value Date/Time   COLORURINE YELLOW 05/27/2019 1321   APPEARANCEUR CLEAR 05/27/2019 1321   LABSPEC 1.020 05/27/2019 1321   PHURINE 5.0 05/27/2019 1321   GLUCOSEU NEGATIVE 05/27/2019 1321   HGBUR SMALL (A) 05/27/2019 1321   BILIRUBINUR NEGATIVE 05/27/2019 1321   KETONESUR NEGATIVE 05/27/2019 1321   PROTEINUR 100 (A) 05/27/2019 1321   NITRITE NEGATIVE 05/27/2019 1321   LEUKOCYTESUR NEGATIVE 05/27/2019 1321   Sepsis Labs Invalid input(s): PROCALCITONIN,  WBC,  LACTICIDVEN Microbiology Recent  Results (from the past 240 hour(s))  SARS Coronavirus 2 by RT PCR (hospital order, performed in [facility] hospital lab) Nasopharyngeal Nasopharyngeal Swab     Status: None   Collection Time: 11/11/19  5:27 PM   Specimen: Nasopharyngeal Swab  Result Value Ref Range Status   SARS Coronavirus 2 NEGATIVE NEGATIVE Final    Comment: (NOTE) SARS-CoV-2 target nucleic acids are NOT DETECTED.  The SARS-CoV-2 RNA is generally detectable in upper and lower respiratory specimens during the acute phase of infection. The lowest concentration of SARS-CoV-2 viral copies this assay can detect is 250 copies / mL. A negative result does not preclude SARS-CoV-2 infection and should not be used as the sole basis for treatment or other patient management decisions.  A negative result may occur with improper specimen collection / handling, submission of specimen other than nasopharyngeal swab, presence of viral mutation(s) within the areas targeted by this assay, and inadequate number of viral copies (<250 copies / mL). A negative result must be combined with clinical observations, patient history, and epidemiological information.  Fact Sheet for Patients:   BoilerBrush.com.cy  Fact Sheet for Healthcare Providers: https://pope.com/  This test is not yet approved or  cleared by the [place] FDA and has been authorized for detection and/or diagnosis of SARS-CoV-2 by FDA under an Emergency Use Authorization (EUA).  This EUA will remain in effect (meaning this test can be used) for the duration of the COVID-19 declaration under Section 564(b)(1) of the Act, 21 U.S.C. section 360bbb-3(b)(1), unless the authorization is terminated or revoked sooner.  Performed at [facility], [address] [place], [place] [address]   Surgical PCR screen     Status: Abnormal   Collection Time: 11/12/19  6:00 AM   Specimen: Nasal Mucosa; Nasal Swab   Result Value Ref Range Status   MRSA, PCR POSITIVE (A) NEGATIVE Final    Comment: RESULT CALLED TO, READ BACK BY AND VERIFIED WITH: TAYLOR,J @ 0942 ON [id number] BY POTEAT,S    Staphylococcus aureus POSITIVE (A) NEGATIVE Final    Comment: (NOTE) The Xpert SA Assay (FDA approved for NASAL specimens in patients 19 years of age and older), is one component of a comprehensive surveillance program. It is not intended to diagnose infection nor to guide or monitor treatment.  Performed at [facility], [address], [place], [place] [address]   SARS CORONAVIRUS 2 (TAT 6-24 HRS) Nasopharyngeal Nasopharyngeal Swab     Status: None   Collection Time: 11/14/19  9:10 AM   Specimen: Nasopharyngeal Swab  Result Value Ref Range Status   SARS Coronavirus 2 NEGATIVE NEGATIVE Final    Comment: (NOTE) SARS-CoV-2 target nucleic acids are NOT DETECTED.  The SARS-CoV-2 RNA is generally detectable in upper and lower respiratory specimens during the acute phase of infection. Negative results do not preclude SARS-CoV-2 infection, do not rule out co-infections with other pathogens, and should not be used as the sole basis for treatment or other patient management decisions. Negative results must be combined with clinical observations, patient history, and epidemiological information. The expected result is Negative.  Fact Sheet for Patients: HairSlick.no  Fact Sheet for Healthcare Providers: quierodirigir.com  This test is not yet approved or cleared by the [place] FDA and  has been authorized for detection and/or diagnosis of SARS-CoV-2 by FDA under an Emergency Use Authorization (EUA). This EUA will remain  in effect (meaning this test can be used) for the duration of the COVID-19 declaration under Se ction 564(b)(1) of the Act, 21 U.S.C. section 360bbb-3(b)(1), unless the authorization is terminated or revoked  sooner.  Performed at [facility], [address], [place], [place] [address]      Time coordinating discharge: 25  minutes  SIGNED: Lanae Boast, MD  [facility] 11/16/2019, 9:32 AM  If 7PM-7AM, please contact night-coverage [email or web address]

## 2019-11-14 NOTE — Progress Notes (Signed)
PROGRESS NOTE    Tina Stein  UEA:[id number] DOB: [birth year] DOA: 11/11/2019 PCP: Laqueta Due., MD   Chef Complaints: Left hip pain  Brief Narrative: 86 year old female with history of advanced dementia requiring 24/7 care, hypothyroidism, hypertension, [facility] resident who was admitted with fall and left hip fracture. Patient is nonambulatory but did propel herself in a wheelchair by shuffling her feet who sustained a fall at the nursing home and sent to the ED. In the ED vitals stable neck and head CT negative, x-ray showed displaced left intertrochanteric fracture, orthopedic was consulted and patient was admitted. Patient underwent medial left femur 7/1  Patient had acute blood loss anemia postop needing blood transfusion and hemoglobin has stabilized  Subjective:  Mumbling words, no meaningful conversation but able to tell me her name today. Assessment & Plan:  Left intertrochanteric hip fracture secondary to fall: S/p left IM nail 7/1.  Continue postop care PT OT as per orthopedics, has advised Lovenox x30 days for DVT prophylaxis and pain control.  PT OT and planning to return to SNF when bed available   Anemia of acute blood loss postop, globin down trended and 6.7 g, status post 2 unit PRBC and hemoglobin is stable.  Monitor.   Recent Labs  Lab 11/11/19 1727 11/13/19 0255 11/13/19 1346 11/14/19 0310  HGB 9.7* 7.1* 6.6* 9.8*  HCT 31.2* 22.6* 20.7* 28.9*   Leukocytosis 17.1.  Resolved likely reactive from fracture.  Advanced dementia: Patient is alert awake oriented x1-0, mumbling words.  Able to tell me her name today.  Continue supportive care.   Essential hypertension: Blood pressure controlled.  Continue current meds  Hypothyroidism continue levothyroxine.   DVT prophylaxis: enoxaparin (LOVENOX) injection 40 mg Start: 11/13/19 0800 SCDs Start: 11/12/19 1709 Code Status: DNR Family Communication: discussed with care team I,Had updated  patient's daughter previously.  Status is: Inpatient  Remains inpatient appropriate because:Ongoing active pain requiring inpatient pain management and For operative intervention of the left hip fracture  Dispo: The patient is from: SNF              Anticipated d/c is to: SNF              Anticipated d/c date is: When bed available              Patient currently medical history for discharge awaiting for bed at SNF.   Nutrition: Diet Order            Diet regular Room service appropriate? Yes; Fluid consistency: Thin  Diet effective now                 Nutrition Problem: Inadequate oral intake Etiology: inability to eat Signs/Symptoms: NPO status Interventions: Refer to RD note for recommendations Body mass index is 22.14 kg/m.  Consultants:see note  Procedures:see note Microbiology:see note  Medications: Scheduled Meds:  cholecalciferol  2,000 Units Oral Daily   docusate sodium  100 mg Oral BID   enoxaparin (LOVENOX) injection  40 mg Subcutaneous Q24H   ferrous sulfate  325 mg Oral TID PC   furosemide  20 mg Oral Daily   gabapentin  100 mg Oral QHS   levothyroxine  50 mcg Oral Q0600   mouth rinse  15 mL Mouth Rinse BID   mupirocin ointment  1 application Nasal BID   senna  1 tablet Oral BID   timolol  1 drop Both Eyes Daily   Continuous Infusions:  0.45 % NaCl with KCl  20 mEq / L Stopped (11/13/19 0715)    Antimicrobials: Anti-infectives (From admission, onward)   Start     Dose/Rate Route Frequency Ordered Stop   11/12/19 2000  ceFAZolin (ANCEF) IVPB 2g/100 mL premix        2 g 200 mL/hr over 30 Minutes Intravenous Every 6 hours 11/12/19 1708 11/13/19 0222   11/12/19 1315  vancomycin (VANCOCIN) IVPB 1000 mg/200 mL premix        1,000 mg 200 mL/hr over 60 Minutes Intravenous  Once 11/12/19 1301 11/12/19 1408   11/12/19 1301  vancomycin (VANCOCIN) 1-5 GM/200ML-% IVPB       Note to Pharmacy: Sharyn Creamer   : cabinet override      11/12/19 1301  11/12/19 1349   11/12/19 0600  ceFAZolin (ANCEF) IVPB 2g/100 mL premix        2 g 200 mL/hr over 30 Minutes Intravenous On call to O.R. 11/11/19 2015 11/12/19 1350       Objective: Vitals: Today's Vitals   11/13/19 2118 11/13/19 2348 11/14/19 0554 11/14/19 1507  BP: (!) 127/105 123/65 124/67 (!) 121/46  Pulse: 89 83 74 70  Resp: 15 15 18 18   Temp: 98.2 F (36.8 C) 98.5 F (36.9 C) 97.6 F (36.4 C) 98.5 F (36.9 C)  TempSrc: Oral Oral Oral Oral  SpO2: 98% 100%  98%  Weight:      Height:      PainSc:        Intake/Output Summary (Last 24 hours) at 11/14/2019 1520 Last data filed at 11/14/2019 0900 Gross per 24 hour  Intake 1043.88 ml  Output 1250 ml  Net -206.12 ml   Filed Weights   11/12/19 1024 11/12/19 2118  Weight: 58.5 kg 58.5 kg   Weight change:    Intake/Output from previous day: 07/02 0701 - 07/03 0700 In: 1223.9 [P.O.:600; I.V.:6.3; Blood:617.6] Out: 850 [Urine:850] Intake/Output this shift: Total I/O In: 60 [P.O.:60] Out: 400 [Urine:400]  Examination:  General exam: AAO0-1 , NAD, weak appearing. HEENT:Oral mucosa moist, Ear/Nose WNL grossly, dentition normal. Respiratory system: bilaterally clear,no wheezing or crackles,no use of accessory muscle Cardiovascular system: S1 & S2 +, No JVD,. Gastrointestinal system: Abdomen soft, NT,ND, BS+ Nervous System:Alert, mumbling words, able to tell me her name, moving arms .Extremities: No edema, distal peripheral pulses palpable.  Left hip surgical site with dressing intact. Skin: No rashes,no icterus. MSK: Normal muscle bulk,tone, power    Data Reviewed: I have personally reviewed following labs and imaging studies CBC: Recent Labs  Lab 11/11/19 1727 11/13/19 0255 11/13/19 1346 11/14/19 0310  WBC 17.1* 7.7  --  9.1  NEUTROABS 15.2*  --   --   --   HGB 9.7* 7.1* 6.6* 9.8*  HCT 31.2* 22.6* 20.7* 28.9*  MCV 96.6 95.4  --  89.8  PLT 342 277  --  235   Basic Metabolic Panel: Recent Labs  Lab  11/11/19 1727 11/13/19 0255 11/14/19 0310  NA 139 137 134*  K 4.0 4.9 4.2  CL 100 103 99  CO2 26 25 27   GLUCOSE 182* 116* 103*  BUN 36* 26* 35*  CREATININE 1.24* 1.02* 1.03*  CALCIUM 9.0 8.5* 8.1*   GFR: Estimated Creatinine Clearance: 32 mL/min (A) (by C-G formula based on SCr of 1.03 mg/dL (H)). Liver Function Tests: No results for input(s): AST, ALT, ALKPHOS, BILITOT, PROT, ALBUMIN in the last 168 hours. No results for input(s): LIPASE, AMYLASE in the last 168 hours. No results for input(s): AMMONIA in the  last 168 hours. Coagulation Profile: No results for input(s): INR, PROTIME in the last 168 hours. Cardiac Enzymes: Recent Labs  Lab 11/11/19 1727  CKTOTAL 38   BNP (last 3 results) No results for input(s): PROBNP in the last 8760 hours. HbA1C: No results for input(s): HGBA1C in the last 72 hours. CBG: No results for input(s): GLUCAP in the last 168 hours. Lipid Profile: No results for input(s): CHOL, HDL, LDLCALC, TRIG, CHOLHDL, LDLDIRECT in the last 72 hours. Thyroid Function Tests: No results for input(s): TSH, T4TOTAL, FREET4, T3FREE, THYROIDAB in the last 72 hours. Anemia Panel: No results for input(s): VITAMINB12, FOLATE, FERRITIN, TIBC, IRON, RETICCTPCT in the last 72 hours. Sepsis Labs: No results for input(s): PROCALCITON, LATICACIDVEN in the last 168 hours.  Recent Results (from the past 240 hour(s))  SARS Coronavirus 2 by RT PCR (hospital order, performed in [facility] hospital lab) Nasopharyngeal Nasopharyngeal Swab     Status: None   Collection Time: 11/11/19  5:27 PM   Specimen: Nasopharyngeal Swab  Result Value Ref Range Status   SARS Coronavirus 2 NEGATIVE NEGATIVE Final    Comment: (NOTE) SARS-CoV-2 target nucleic acids are NOT DETECTED.  The SARS-CoV-2 RNA is generally detectable in upper and lower respiratory specimens during the acute phase of infection. The lowest concentration of SARS-CoV-2 viral copies this assay can detect is  250 copies / mL. A negative result does not preclude SARS-CoV-2 infection and should not be used as the sole basis for treatment or other patient management decisions.  A negative result may occur with improper specimen collection / handling, submission of specimen other than nasopharyngeal swab, presence of viral mutation(s) within the areas targeted by this assay, and inadequate number of viral copies (<250 copies / mL). A negative result must be combined with clinical observations, patient history, and epidemiological information.  Fact Sheet for Patients:   BoilerBrush.com.cy  Fact Sheet for Healthcare Providers: https://pope.com/  This test is not yet approved or  cleared by the [place] FDA and has been authorized for detection and/or diagnosis of SARS-CoV-2 by FDA under an Emergency Use Authorization (EUA).  This EUA will remain in effect (meaning this test can be used) for the duration of the COVID-19 declaration under Section 564(b)(1) of the Act, 21 U.S.C. section 360bbb-3(b)(1), unless the authorization is terminated or revoked sooner.  Performed at [facility], [address], [place], [place] [address]   Surgical PCR screen     Status: Abnormal   Collection Time: 11/12/19  6:00 AM   Specimen: Nasal Mucosa; Nasal Swab  Result Value Ref Range Status   MRSA, PCR POSITIVE (A) NEGATIVE Final    Comment: RESULT CALLED TO, READ BACK BY AND VERIFIED WITH: TAYLOR,J @ 0942 ON [id number] BY POTEAT,S    Staphylococcus aureus POSITIVE (A) NEGATIVE Final    Comment: (NOTE) The Xpert SA Assay (FDA approved for NASAL specimens in patients 17 years of age and older), is one component of a comprehensive surveillance program. It is not intended to diagnose infection nor to guide or monitor treatment. Performed at [facility], [address] [place], [place] [address]       Radiology  Studies: No results found.   LOS: 3 days   Lanae Boast, MD [facility]  11/14/2019, 3:20 PM

## 2019-11-14 NOTE — Evaluation (Signed)
Physical Therapy Evaluation Patient Details Name: Tina Stein MRN: [id number] DOB: [birth year] Today's Date: 11/14/2019   History of Present Illness  Pt admitted s/p fall at SNF with L hip fx and now s/p IM nailing.  Pt with hx of dementia  Clinical Impression  Pt admitted as above and presenting with functional mobility limitations 2* decreased L LE strength; post op pain and significant cognitive deficits related to dementia.  Pt will need follow up care at SNF level.    Follow Up Recommendations SNF    Equipment Recommendations  None recommended by PT    Recommendations for Other Services       Precautions / Restrictions Precautions Precautions: Fall Restrictions Weight Bearing Restrictions: No LLE Weight Bearing: Weight bearing as tolerated      Mobility  Bed Mobility Overal bed mobility: Needs Assistance Bed Mobility: Supine to Sit;Sit to Supine     Supine to sit: Total assist;+2 for physical assistance;+2 for safety/equipment Sit to supine: Total assist;+2 for physical assistance;+2 for safety/equipment   General bed mobility comments: Pt not following cues to move supine<>sit; pt held sitting balance momentarily then pushed into extension attempting to fall back on bed  Transfers                 General transfer comment: NT 2* cognitive status and pt inability to follow cues to participate with PT  Ambulation/Gait                Stairs            Wheelchair Mobility    Modified Rankin (Stroke Patients Only)       Balance Overall balance assessment: Needs assistance Sitting-balance support: Feet supported;No upper extremity supported Sitting balance-Leahy Scale: Poor Sitting balance - Comments: push into extension                                     Pertinent Vitals/Pain Pain Assessment: Faces Faces Pain Scale: Hurts little more Pain Intervention(s): Premedicated before session    Home Living Family/patient  expects to be discharged to:: Skilled nursing facility                      Prior Function Level of [place]: Needs assistance   Gait / Transfers Assistance Needed: Pt shuffled around at SNF in [facility] but was nonambulatory per chart     Comments: Assist for all tasks 2* dementia     Hand Dominance        Extremity/Trunk Assessment   Upper Extremity Assessment Upper Extremity Assessment: Difficult to assess due to impaired cognition    Lower Extremity Assessment Lower Extremity Assessment: Difficult to assess due to impaired cognition    Cervical / Trunk Assessment Cervical / Trunk Assessment: Kyphotic  Communication   Communication: Other (comment) (dementia)  Cognition Arousal/Alertness: Awake/alert Behavior During Therapy: Anxious;Agitated Overall Cognitive Status: History of cognitive impairments - at baseline                                 General Comments: Pt verbalizing but unintelligible and increased with attempts to mobilize      General Comments      Exercises     Assessment/Plan    PT Assessment Patient needs continued PT services  PT Problem List Decreased strength;Decreased range of motion;Decreased activity tolerance;Decreased balance;Decreased  mobility;Decreased knowledge of use of DME;Decreased cognition;Pain       PT Treatment Interventions DME instruction;Functional mobility training;Therapeutic activities;Therapeutic exercise;Balance training;Cognitive remediation    PT Goals (Current goals can be found in the Care Plan section)  Acute Rehab PT Goals Patient Stated Goal: No goals stated PT Goal Formulation: Patient unable to participate in goal setting Time For Goal Achievement: 11/28/19 Potential to Achieve Goals: Poor    Frequency Min 2X/week   Barriers to discharge        Co-evaluation               AM-PAC PT "6 Clicks" Mobility  Outcome Measure Help needed turning from your back to your side  while in a flat bed without using bedrails?: Total Help needed moving from lying on your back to sitting on the side of a flat bed without using bedrails?: Total Help needed moving to and from a bed to a chair (including a wheelchair)?: Total Help needed standing up from a chair using your arms (e.g., wheelchair or bedside chair)?: Total Help needed to walk in hospital room?: Total Help needed climbing 3-5 steps with a railing? : Total 6 Click Score: 6    End of Session Equipment Utilized During Treatment: Gait belt Activity Tolerance: Patient tolerated treatment well Patient left: in bed;with call bell/phone within reach;with bed alarm set Nurse Communication: Mobility status PT Visit Diagnosis: Difficulty in walking, not elsewhere classified (R26.2);Muscle weakness (generalized) (M62.81);History of falling (Z91.81)    Time: 4010-2725 PT Time Calculation (min) (ACUTE ONLY): 12 min   Charges:   PT Evaluation $PT Eval Low Complexity: 1 Low          Mauro Kaufmann PT Acute Rehabilitation Services Pager [phone] Office [phone]   Teralyn Mullins 11/14/2019, 1:37 PM

## 2019-11-14 NOTE — Progress Notes (Signed)
Subjective: 2 Days Post-Op s/p Procedure(s): INTRAMEDULLARY (IM) NAIL FEMORAL  Patient unable to communicate needs or follow direction, mumbling words on exam.  Objective:  PE: VITALS:   Vitals:   11/13/19 2100 11/13/19 2118 11/13/19 2348 11/14/19 0554  BP: 125/64 (!) 127/105 123/65 124/67  Pulse: 77 89 83 74  Resp: 15 15 15 18   Temp: 98.2 F (36.8 C) 98.2 F (36.8 C) 98.5 F (36.9 C) 97.6 F (36.4 C)  TempSrc: Oral Oral Oral Oral  SpO2: 100% 98% 100%   Weight:      Height:       Gen: Laying comfortably in bed, in no acute distress GI: Patient did not indicate tenderness on palpation of abdomen, abdomen soft MSK: + DP pulse, unable to assess distal sensation. Patient unable to follow directions to assess dorsiflexion or plantarflexion. Surgical dressings dry and intact with no drainage. Leg lengths appear equal.   LABS  Results for orders placed or performed during the hospital encounter of 11/11/19 (from the past 24 hour(s))  Hemoglobin and hematocrit, blood     Status: Abnormal   Collection Time: 11/13/19  1:46 PM  Result Value Ref Range   Hemoglobin 6.6 (LL) 12.0 - 15.0 g/dL   HCT 01/14/20 (L) 36 - 46 %  Prepare RBC (crossmatch)     Status: None   Collection Time: 11/13/19  2:28 PM  Result Value Ref Range   Order Confirmation      ORDER PROCESSED BY BLOOD BANK Performed at [facility], [address] [place], [place]   Basic metabolic panel     Status: Abnormal   Collection Time: 11/14/19  3:10 AM  Result Value Ref Range   Sodium 134 (L) 135 - 145 mmol/L   Potassium 4.2 3.5 - 5.1 mmol/L   Chloride 99 98 - 111 mmol/L   CO2 27 22 - 32 mmol/L   Glucose, Bld 103 (H) 70 - 99 mg/dL   BUN 35 (H) 8 - 23 mg/dL   Creatinine, Ser 01/15/20 (H) 0.44 - 1.00 mg/dL   Calcium 8.1 (L) 8.9 - 10.3 mg/dL   GFR calc non Af Amer 48 (L) >60 mL/min   GFR calc Af Amer 56 (L) >60 mL/min   Anion gap 8 5 - 15  CBC     Status: Abnormal   Collection Time:  11/14/19  3:10 AM  Result Value Ref Range   WBC 9.1 4.0 - 10.5 K/uL   RBC 3.22 (L) 3.87 - 5.11 MIL/uL   Hemoglobin 9.8 (L) 12.0 - 15.0 g/dL   HCT 01/15/20 (L) 36 - 46 %   MCV 89.8 80.0 - 100.0 fL   MCH 30.4 26.0 - 34.0 pg   MCHC 33.9 30.0 - 36.0 g/dL   RDW 26.2 03.5 - 59.7 %   Platelets 235 150 - 400 K/uL   nRBC 0.0 0.0 - 0.2 %    DG C-Arm 1-60 Min-No Report  Result Date: 11/12/2019 Fluoroscopy was utilized by the requesting physician.  No radiographic interpretation.   DG HIP OPERATIVE UNILAT W OR W/O PELVIS LEFT  Result Date: 11/12/2019 CLINICAL DATA:  Left IM nail EXAM: OPERATIVE LEFT HIP (WITH PELVIS IF PERFORMED) 3 VIEWS TECHNIQUE: Fluoroscopic spot image(s) were submitted for interpretation post-operatively. COMPARISON:  11/11/2019 FINDINGS: Spot images demonstrate placement of intramedullary nail and dynamic hip screw across the left femoral intertrochanteric fracture. Normal alignment. No hardware complicating feature. IMPRESSION: Internal fixation.  No visible complicating feature. Electronically  Signed   By: Charlett Nose M.D.   On: 11/12/2019 16:14    Assessment/Plan: Active Problems:   Dementia without behavioral disturbance (HCC)   Left displaced femoral neck fracture (HCC)   Hypothyroidism   HTN (hypertension)   Left intertrochanteric femur fracture: 2 Days Post-Op s/p Procedure(s):INTRAMEDULLARY (IM) NAIL FEMORAL  Acute blood loss anemia: - Hbg 9.7 at admission, decreased to 6.6 yesterday afternoon, transfusion given, Hbg stable at 9.8 today  Weightbearing: WBAT LLE, patient did not ambulate at baseline but did propel herself in a wheelchair by shuffling her feet Insicional and dressing care: Reinforce dressings as needed VTE prophylaxis: lovenox x 30 days Pain control: continue current regimen, tylenol for pain, limit tramadol use - only to be used if severe pain Follow - up plan: Follow up with Dr. Dion Saucier in  2 weeks  Dispo: pending PT eval, patient will likely d/c  back to SNF   Contact information:   Weekdays 8-5 Janine Ores, PA-C [phone] A fter hours and holidays please check [email or web address] for group call information for [facility]  Armida Sans 11/14/2019, 11:55 AM

## 2019-11-15 LAB — BPAM RBC
Blood Product Expiration Date: 202107262359
Blood Product Expiration Date: 202107302359
ISSUE DATE / TIME: 202107021708
ISSUE DATE / TIME: 202107022057
Unit Type and Rh: 1700
Unit Type and Rh: 1700

## 2019-11-15 LAB — TYPE AND SCREEN
ABO/RH(D): B NEG
Antibody Screen: NEGATIVE
Unit division: 0
Unit division: 0

## 2019-11-15 LAB — BASIC METABOLIC PANEL
Anion gap: 9 (ref 5–15)
BUN: 26 mg/dL — ABNORMAL HIGH (ref 8–23)
CO2: 27 mmol/L (ref 22–32)
Calcium: 8.4 mg/dL — ABNORMAL LOW (ref 8.9–10.3)
Chloride: 101 mmol/L (ref 98–111)
Creatinine, Ser: 0.85 mg/dL (ref 0.44–1.00)
GFR calc Af Amer: >60 mL/min (ref >60)
GFR calc non Af Amer: >60 mL/min (ref >60)
Glucose, Bld: 94 mg/dL (ref 70–99)
Potassium: 4 mmol/L (ref 3.5–5.1)
Sodium: 137 mmol/L (ref 135–145)

## 2019-11-15 LAB — CBC
HCT: 30.4 % — ABNORMAL LOW (ref 36.0–46.0)
Hemoglobin: 9.8 g/dL — ABNORMAL LOW (ref 12.0–15.0)
MCH: 29.8 pg (ref 26.0–34.0)
MCHC: 32.2 g/dL (ref 30.0–36.0)
MCV: 92.4 fL (ref 80.0–100.0)
Platelets: 241 10*3/uL (ref 150–400)
RBC: 3.29 MIL/uL — ABNORMAL LOW (ref 3.87–5.11)
RDW: 14.7 % (ref 11.5–15.5)
WBC: 7.6 10*3/uL (ref 4.0–10.5)
nRBC: 0 % (ref 0.0–0.2)

## 2019-11-15 MED ORDER — ENOXAPARIN SODIUM 40 MG/0.4ML ~~LOC~~ SOLN: 40.0000 mg | SUBCUTANEOUS | 0 refills | Status: AC

## 2019-11-15 MED ORDER — FERROUS SULFATE 325 (65 FE) MG PO TABS: 325.0000 mg | ORAL_TABLET | Freq: Three times a day (TID) | ORAL | 3 refills | Status: AC

## 2019-11-15 NOTE — TOC Progression Note (Signed)
Transition of Care [facility]) - Progression Note    Patient Details  Name: Tina Stein MRN: [id number] Date of Birth: 1927-06-13  [facility] CM/SW Contact  Coralyn Helling, LCSW Phone Number: 11/15/2019, 10:32 AM  Clinical Narrative:   LCSW unable to reach [facility],  admissions coordinator. Left voicemail. LCSW spoke with RN at facility, [facility], who states she cannot give permission for patient to return and we have to speak to [facility], Admissions Coordinator, or Director who are not available today.     Expected Discharge Plan: Skilled Nursing Facility Barriers to Discharge: Continued Medical Work up  Expected Discharge Plan and Services Expected Discharge Plan: Skilled Nursing Facility In-house Referral: Clinical Social Work Discharge Planning Services: NA   Living arrangements for the past 2 months: Skilled Nursing Facility                 DME Arranged: N/A DME Agency: NA       HH Arranged: NA HH Agency: NA         Social Determinants of Health (SDOH) Interventions    Readmission Risk Interventions No flowsheet data found.

## 2019-11-15 NOTE — Plan of Care (Signed)
  Problem: Activity: Goal: Risk for activity intolerance will decrease Outcome: Progressing   Problem: Nutrition: Goal: Adequate nutrition will be maintained Outcome: Progressing   Problem: Coping: Goal: Level of anxiety will decrease Outcome: Progressing   

## 2019-11-15 NOTE — Progress Notes (Signed)
     Subjective: 3 Days Post-Op s/p Procedure(s): INTRAMEDULLARY (IM) NAIL FEMORAL  Patient unable to communicate needs or follow direction, mumbling words on exam.   Objective:  PE: VITALS:   Vitals:   11/14/19 0554 11/14/19 1507 11/14/19 2039 11/15/19 0537  BP: 124/67 (!) 121/46 (!) 140/40 (!) 101/48  Pulse: 74 70 (!) 58 (!) 54  Resp: 18 18 18 15   Temp: 97.6 F (36.4 C) 98.5 F (36.9 C) 98 F (36.7 C) 97.6 F (36.4 C)  TempSrc: Oral Oral Oral Oral  SpO2:  98%    Weight:      Height:       Gen: Resting comfortably in bed, in no acute distress MSK: + DP pulse, unable to assess distal sensation due to patient's mental state. Patient unable to follow directions to assess dorsiflexion or plantarflexion. Surgical dressings dry and intact with no drainage. Leg lengths appear equal.   LABS  Results for orders placed or performed during the hospital encounter of 11/11/19 (from the past 24 hour(s))  Basic metabolic panel     Status: Abnormal   Collection Time: 11/15/19  3:45 AM  Result Value Ref Range   Sodium 137 135 - 145 mmol/L   Potassium 4.0 3.5 - 5.1 mmol/L   Chloride 101 98 - 111 mmol/L   CO2 27 22 - 32 mmol/L   Glucose, Bld 94 70 - 99 mg/dL   BUN 26 (H) 8 - 23 mg/dL   Creatinine, Ser 01/16/20 0.44 - 1.00 mg/dL   Calcium 8.4 (L) 8.9 - 10.3 mg/dL   GFR calc non Af Amer >60 >60 mL/min   GFR calc Af Amer >60 >60 mL/min   Anion gap 9 5 - 15  CBC     Status: Abnormal   Collection Time: 11/15/19  3:45 AM  Result Value Ref Range   WBC 7.6 4.0 - 10.5 K/uL   RBC 3.29 (L) 3.87 - 5.11 MIL/uL   Hemoglobin 9.8 (L) 12.0 - 15.0 g/dL   HCT 01/16/20 (L) 36 - 46 %   MCV 92.4 80.0 - 100.0 fL   MCH 29.8 26.0 - 34.0 pg   MCHC 32.2 30.0 - 36.0 g/dL   RDW 32.4 40.1 - 02.7 %   Platelets 241 150 - 400 K/uL   nRBC 0.0 0.0 - 0.2 %    No results found.  Assessment/Plan: Active Problems:   Dementia without behavioral disturbance (HCC)   Left displaced femoral neck fracture (HCC)    Hypothyroidism   HTN (hypertension)   Left intertrochanteric femur fracture: 3 Days Post-Op s/p Procedure(s):INTRAMEDULLARY (IM) NAIL FEMORAL  Acute blood loss anemia: - Hbg 9.7 at admission, Hbg stable at 9.8 today  Weightbearing: WBAT LLE, patient did not ambulate at baseline but did propel herself in a wheelchair by shuffling her feet Insicional and dressing care: Reinforce dressings as needed VTE prophylaxis: lovenox x 30 days Pain control: continue current regimen, tylenol for pain, limit tramadol use - only to be used if severe pain Follow - up plan: Follow up with Dr. 25.3 in  2 weeks  Dispo: PT recommending return to SNF  Printed prescription for Lovenox and placed in patient's chart for discharge. Cleared for discharge from orthopedics standpoint once cleared by medicine team and therapies.   Contact information:   After hours and holidays please check [email or web address] for group call information for [facility]  Dion Saucier 11/15/2019, 9:42 AM

## 2019-11-15 NOTE — Plan of Care (Signed)
  Problem: Education: Goal: Knowledge of General Education information will improve Description: Including pain rating scale, medication(s)/side effects and non-pharmacologic comfort measures Outcome: Progressing   Problem: Health Behavior/Discharge Planning: Goal: Ability to manage health-related needs will improve Outcome: Progressing   Problem: Clinical Measurements: Goal: Ability to maintain clinical measurements within normal limits will improve Outcome: Progressing Goal: Will remain free from infection Outcome: Progressing Goal: Diagnostic test results will improve Outcome: Progressing Goal: Respiratory complications will improve Outcome: Progressing Goal: Cardiovascular complication will be avoided Outcome: Progressing   Problem: Activity: Goal: Risk for activity intolerance will decrease Outcome: Progressing   Problem: Activity: Goal: Risk for activity intolerance will decrease Outcome: Progressing   Problem: Nutrition: Goal: Adequate nutrition will be maintained Outcome: Progressing   Problem: Elimination: Goal: Will not experience complications related to bowel motility Outcome: Progressing Goal: Will not experience complications related to urinary retention Outcome: Progressing   Problem: Pain Managment: Goal: General experience of comfort will improve Outcome: Progressing   Problem: Safety: Goal: Ability to remain free from injury will improve Outcome: Progressing   Problem: Skin Integrity: Goal: Risk for impaired skin integrity will decrease Outcome: Progressing   Problem: Education: Goal: Verbalization of understanding the information provided (i.e., activity precautions, restrictions, etc) will improve Outcome: Progressing Goal: Individualized Educational Video(s) Outcome: Progressing   Problem: Activity: Goal: Ability to ambulate and perform ADLs will improve Outcome: Progressing   Problem: Clinical Measurements: Goal: Postoperative  complications will be avoided or minimized Outcome: Progressing   Problem: Self-Concept: Goal: Ability to maintain and perform role responsibilities to the fullest extent possible will improve Outcome: Progressing   Problem: Pain Management: Goal: Pain level will decrease Outcome: Progressing

## 2019-11-16 NOTE — Progress Notes (Signed)
Verbal report given to Shona Needles RN at [facility].

## 2019-11-16 NOTE — Plan of Care (Signed)
Pt is transferred to [facility]  by [facility]. Daughter is updated.

## 2019-11-16 NOTE — Plan of Care (Signed)
  Problem: Education: Goal: Knowledge of General Education information will improve Description Including pain rating scale, medication(s)/side effects and non-pharmacologic comfort measures Outcome: Progressing   Problem: Health Behavior/Discharge Planning: Goal: Ability to manage health-related needs will improve Outcome: Progressing   Problem: Clinical Measurements: Goal: Ability to maintain clinical measurements within normal limits will improve Outcome: Progressing Goal: Will remain free from infection Outcome: Progressing Goal: Diagnostic test results will improve Outcome: Progressing Goal: Respiratory complications will improve Outcome: Progressing Goal: Cardiovascular complication will be avoided Outcome: Progressing   Problem: Nutrition: Goal: Adequate nutrition will be maintained Outcome: Progressing   Problem: Coping: Goal: Level of anxiety will decrease Outcome: Progressing   Problem: Elimination: Goal: Will not experience complications related to bowel motility Outcome: Progressing Goal: Will not experience complications related to urinary retention Outcome: Progressing   Problem: Pain Managment: Goal: General experience of comfort will improve Outcome: Progressing   Problem: Safety: Goal: Ability to remain free from injury will improve Outcome: Progressing   Problem: Skin Integrity: Goal: Risk for impaired skin integrity will decrease Outcome: Progressing   Problem: Education: Goal: Verbalization of understanding the information provided (i.e., activity precautions, restrictions, etc) will improve Outcome: Progressing Goal: Individualized Educational Video(s) Outcome: Progressing   Problem: Activity: Goal: Ability to ambulate and perform ADLs will improve Outcome: Progressing   Problem: Clinical Measurements: Goal: Postoperative complications will be avoided or minimized Outcome: Progressing   Problem: Self-Concept: Goal: Ability to  maintain and perform role responsibilities to the fullest extent possible will improve Outcome: Progressing   Problem: Pain Management: Goal: Pain level will decrease Outcome: Progressing   

## 2019-11-16 NOTE — TOC Transition Note (Signed)
Transition of Care [facility]) - CM/SW Discharge Note   Patient Details  Name: Tina Stein MRN: [id number] Date of Birth: 1928-03-02  [facility] CM/SW Contact:  Amada Jupiter, LCSW Phone Number: 11/16/2019, 10:20 AM   Clinical Narrative:   Able to confirm facility ready for pt return and plan [facility] transport back this afternoon.  Daughter aware.  No further TOC needs.    Final next level of care: Skilled Nursing Facility Barriers to Discharge: Barriers Resolved   Patient Goals and CMS Choice        Discharge Placement              Patient chooses bed at: [facility] Patient to be transferred to facility by: [facility] Name of family member notified: daughter, Merryl Hacker Patient and family notified of of transfer: 11/16/19  Discharge Plan and Services In-house Referral: Clinical Social Work Discharge Planning Services: NA            DME Arranged: N/A DME Agency: NA       HH Arranged: NA HH Agency: NA        Social Determinants of Health (SDOH) Interventions     Readmission Risk Interventions No flowsheet data found.

## 2021-05-14 DEATH — deceased

## 2022-01-02 IMAGING — CR DG ELBOW COMPLETE 3+V*L*
4 series · 4 of 4 positions shown · non-contrast
Comparison: None.

CLINICAL DATA: Unwitnessed fall.

EXAM:
LEFT ELBOW - COMPLETE 3+ VIEW

[x elbow ap left]
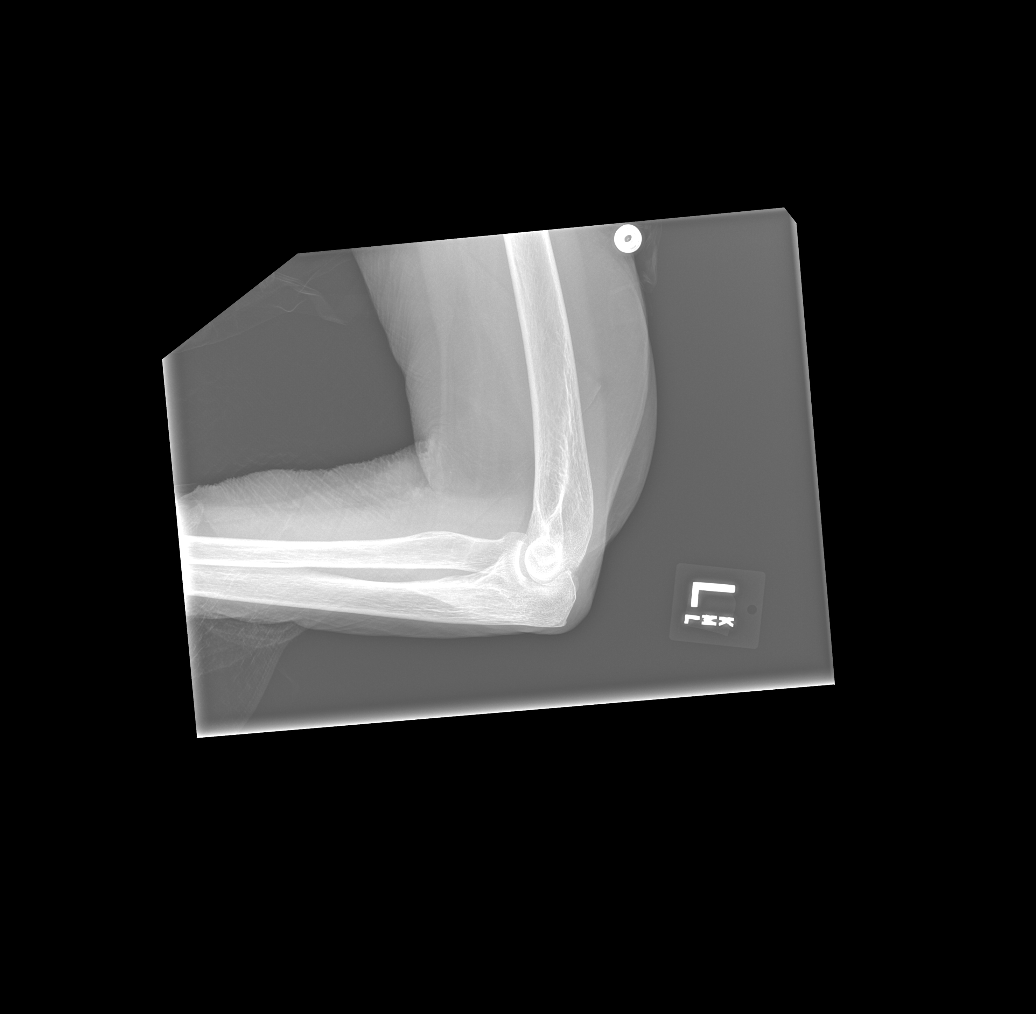

[x elbow obl left (1 of 2)]
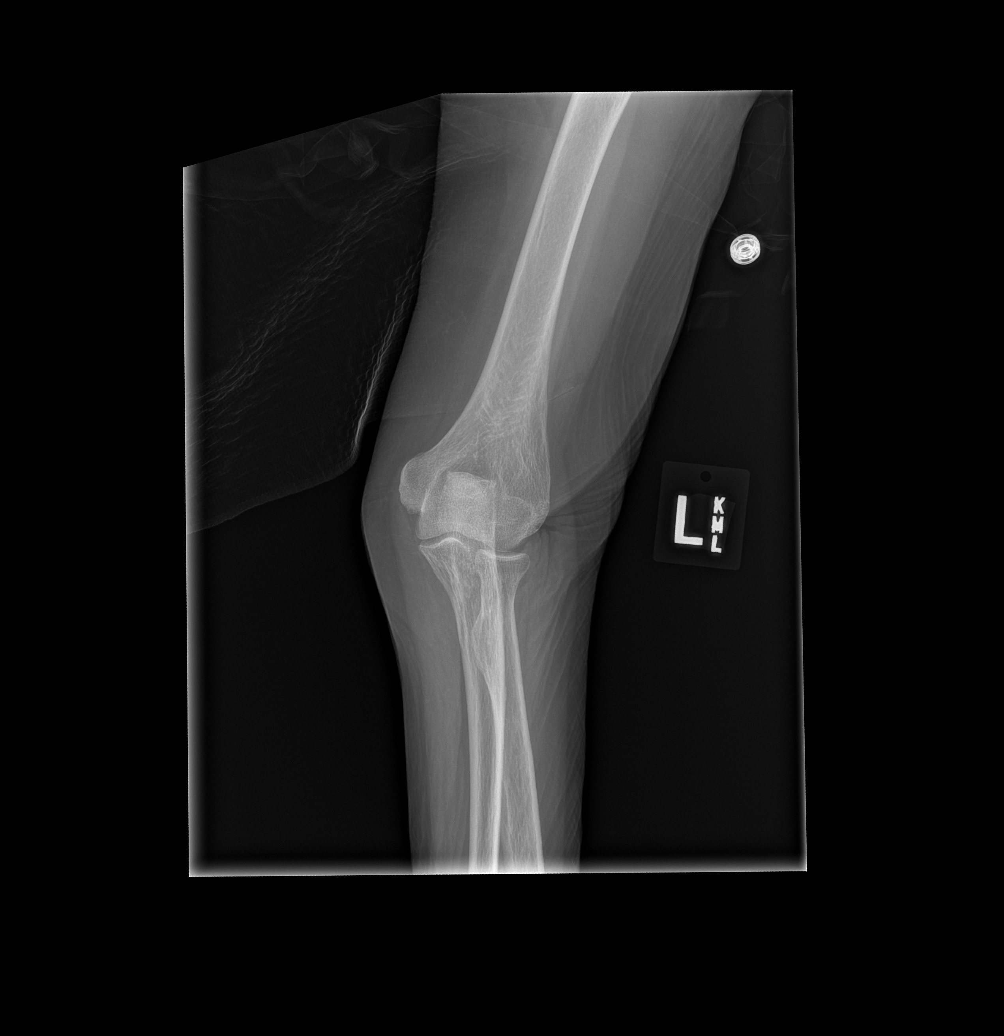

[x elbow obl left (2 of 2)]
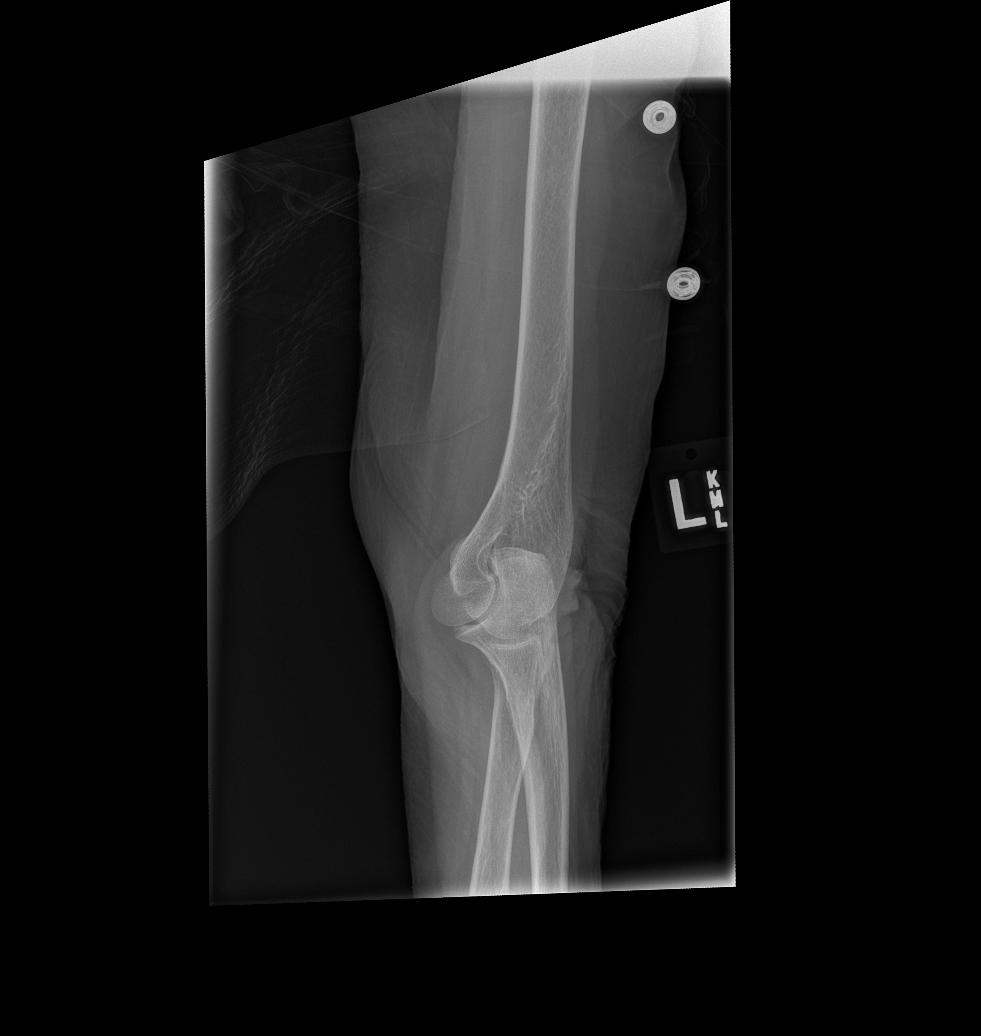

[x elbow lat left]
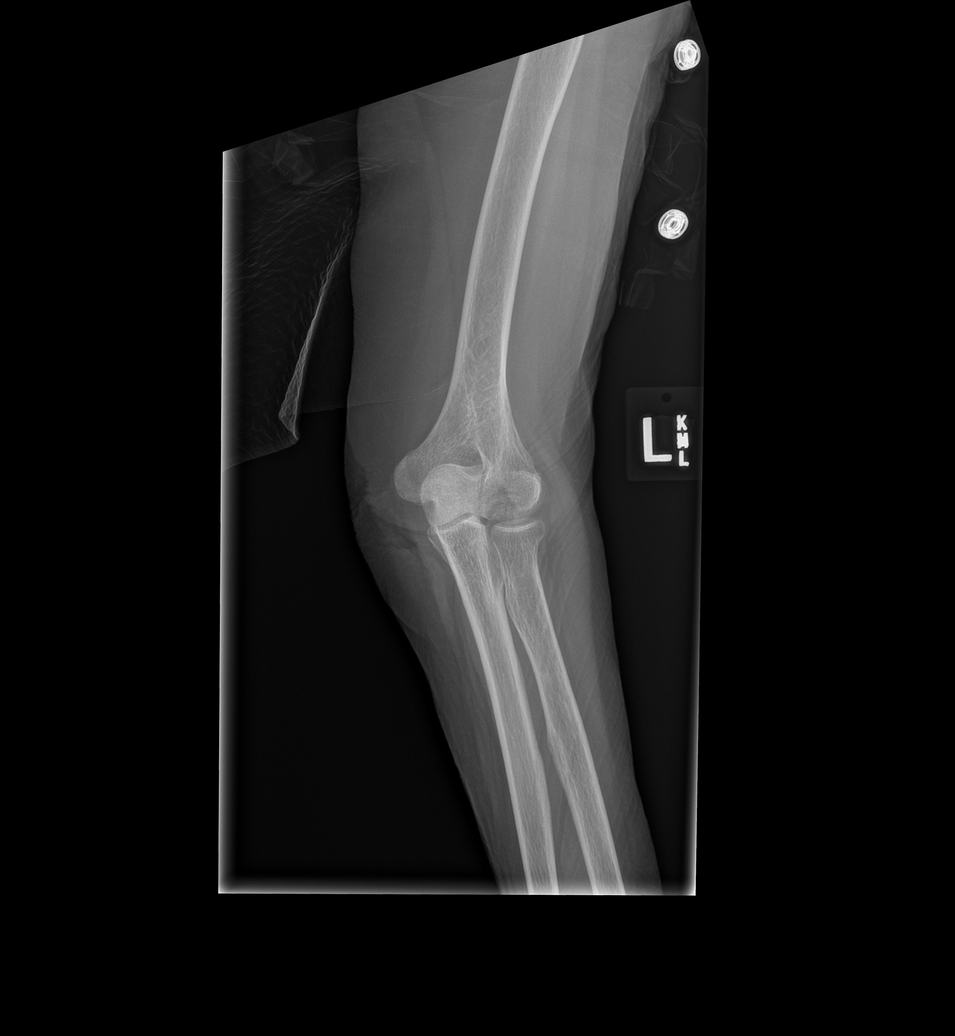

[4 of 4 positions shown; findings below may reference images not displayed]

FINDINGS: There is no evidence of fracture, dislocation, or joint effusion.
There is no evidence of arthropathy or other focal bone abnormality.
Soft tissues are unremarkable.
IMPRESSION: Negative.

## 2022-01-02 IMAGING — CR DG FEMUR 2+V*L*
4 series · 4 of 4 positions shown · non-contrast
Comparison: None.

CLINICAL DATA: Unwitnessed fall.

EXAM:
LEFT FEMUR 2 VIEWS

[t hip ap left]
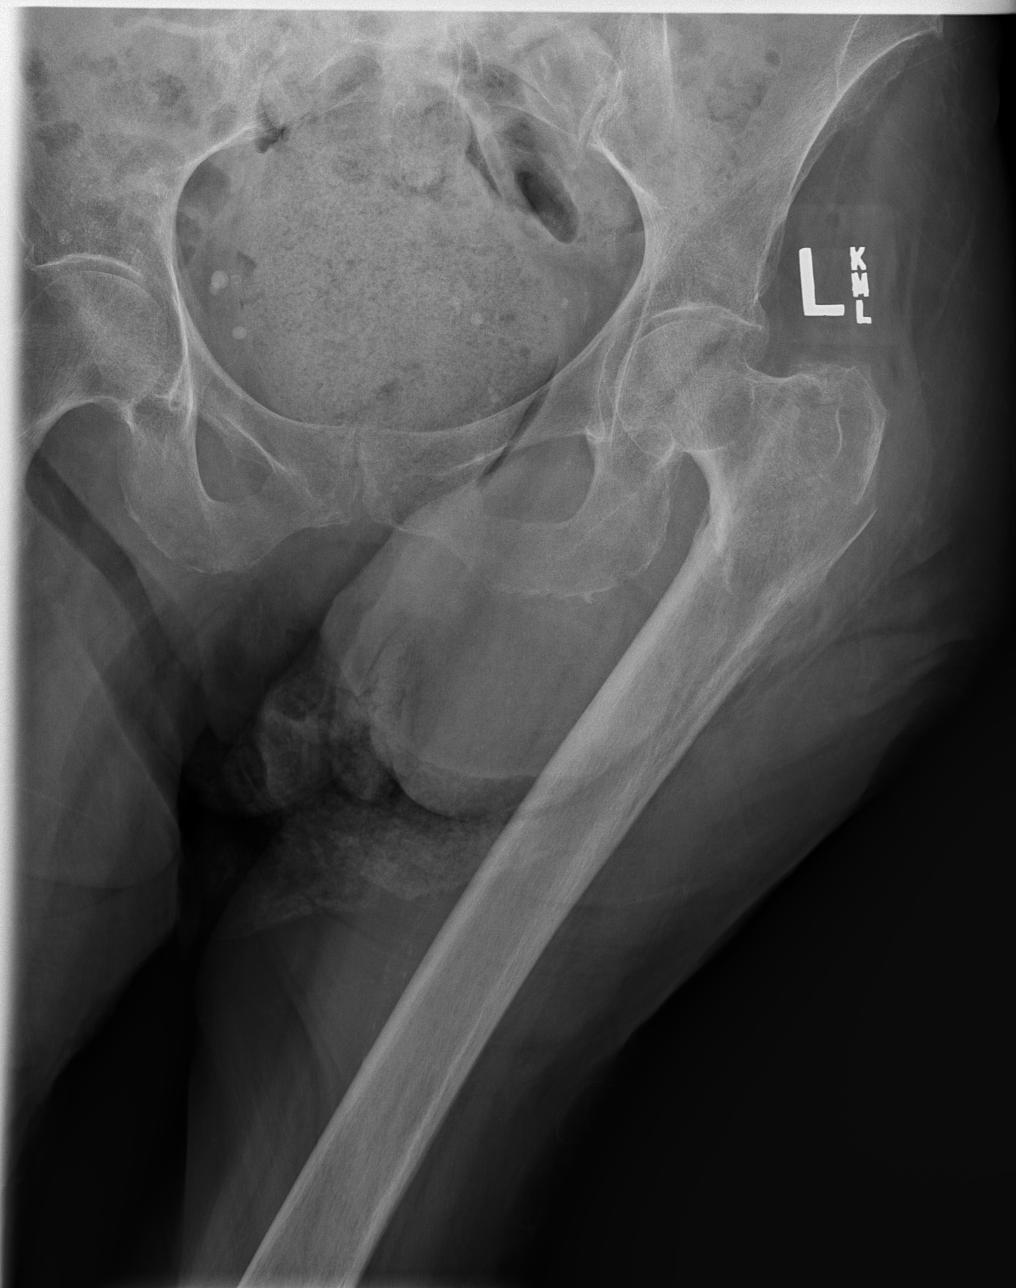

[t hip frog leg left]
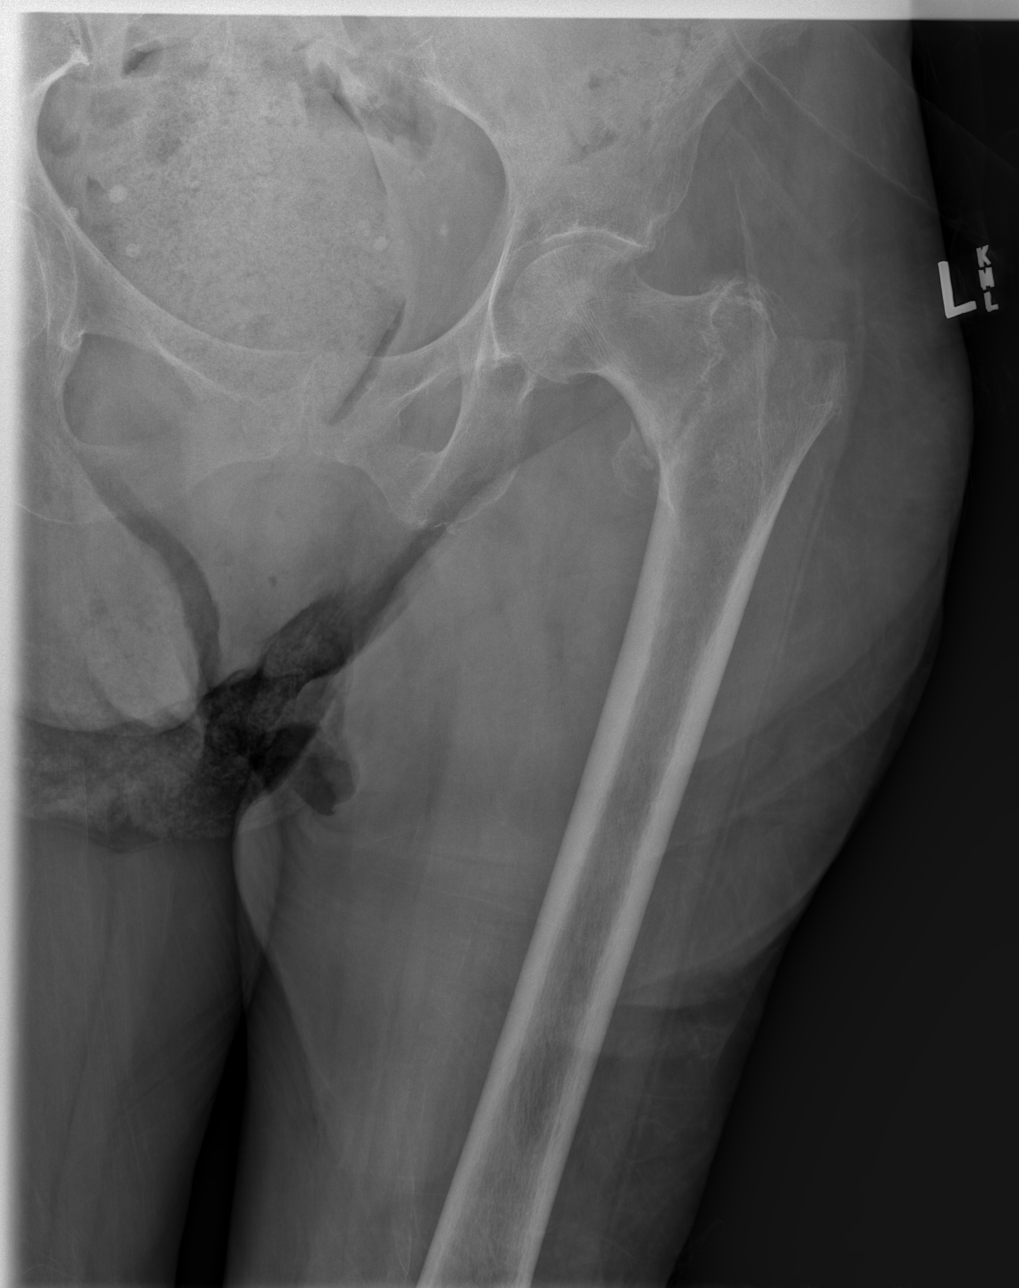

[t femur distal ap left]
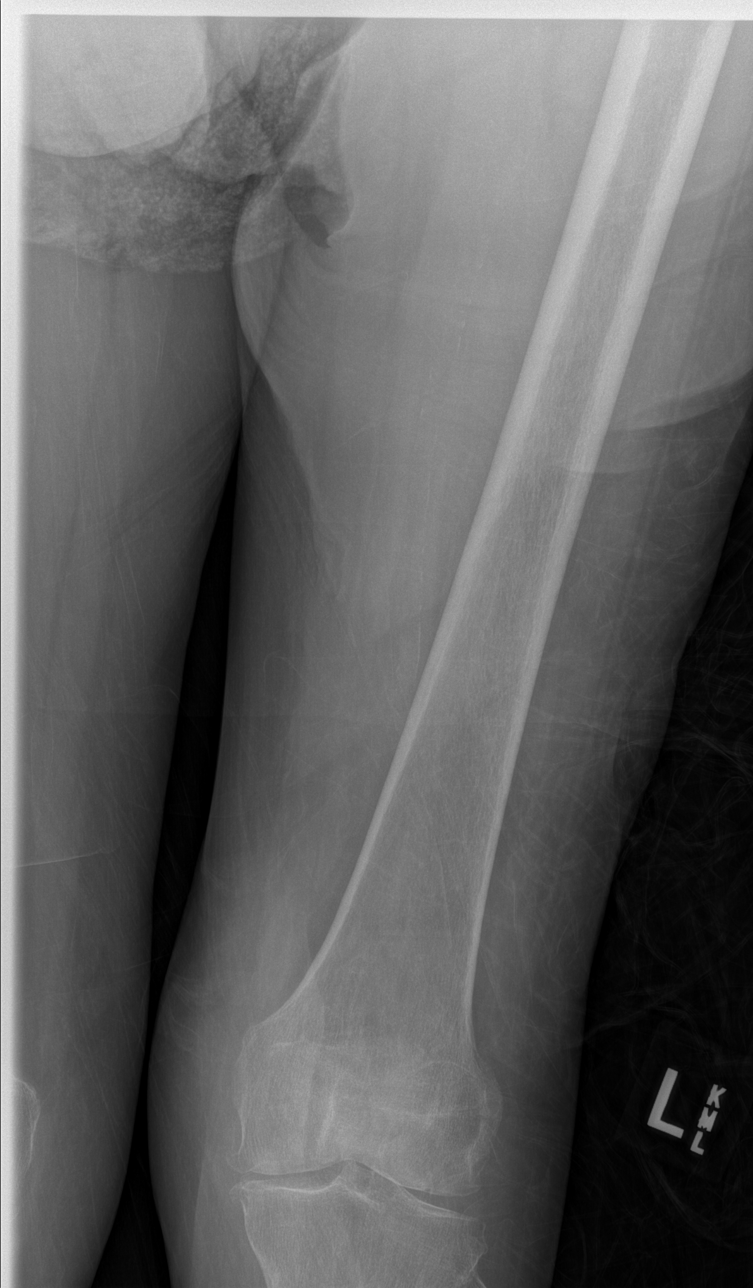

[x femur distal lat left]
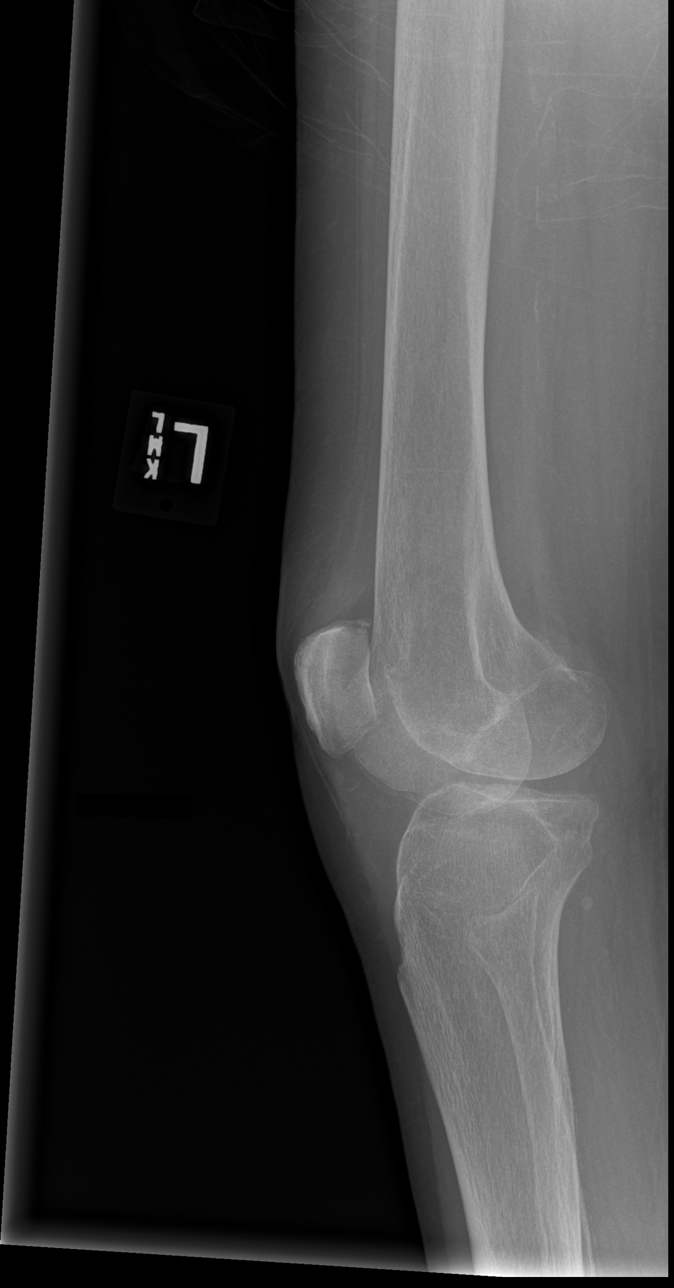

[4 of 4 positions shown; findings below may reference images not displayed]

FINDINGS: Moderately displaced fracture is seen involving the
intertrochanteric region of the proximal left femur.
IMPRESSION: Moderately displaced proximal left femoral intertrochanteric
fracture.
